# Patient Record
Sex: Female | Born: 1985 | Race: White | Hispanic: No | Marital: Married | Smoking: Current every day smoker
Health system: Southern US, Community
[De-identification: ages and names within clinical notes are randomized; demographics above are authoritative.]

## PROBLEM LIST (undated history)

## (undated) DIAGNOSIS — K829 Disease of gallbladder, unspecified: Secondary | ICD-10-CM

## (undated) DIAGNOSIS — Z3A19 19 weeks gestation of pregnancy: Secondary | ICD-10-CM

## (undated) DIAGNOSIS — F329 Major depressive disorder, single episode, unspecified: Secondary | ICD-10-CM

## (undated) DIAGNOSIS — E559 Vitamin D deficiency, unspecified: Secondary | ICD-10-CM

## (undated) DIAGNOSIS — O139 Gestational [pregnancy-induced] hypertension without significant proteinuria, unspecified trimester: Secondary | ICD-10-CM

## (undated) DIAGNOSIS — N12 Tubulo-interstitial nephritis, not specified as acute or chronic: Secondary | ICD-10-CM

## (undated) DIAGNOSIS — I10 Essential (primary) hypertension: Secondary | ICD-10-CM

## (undated) DIAGNOSIS — K219 Gastro-esophageal reflux disease without esophagitis: Secondary | ICD-10-CM

## (undated) DIAGNOSIS — M549 Dorsalgia, unspecified: Secondary | ICD-10-CM

## (undated) DIAGNOSIS — G4733 Obstructive sleep apnea (adult) (pediatric): Secondary | ICD-10-CM

## (undated) DIAGNOSIS — K915 Postcholecystectomy syndrome: Secondary | ICD-10-CM

## (undated) DIAGNOSIS — R002 Palpitations: Secondary | ICD-10-CM

## (undated) DIAGNOSIS — E119 Type 2 diabetes mellitus without complications: Secondary | ICD-10-CM

## (undated) DIAGNOSIS — I4729 Other ventricular tachycardia: Secondary | ICD-10-CM

## (undated) DIAGNOSIS — N390 Urinary tract infection, site not specified: Secondary | ICD-10-CM

## (undated) DIAGNOSIS — F410 Panic disorder [episodic paroxysmal anxiety] without agoraphobia: Secondary | ICD-10-CM

## (undated) DIAGNOSIS — I493 Ventricular premature depolarization: Secondary | ICD-10-CM

## (undated) DIAGNOSIS — F32A Depression, unspecified: Secondary | ICD-10-CM

## (undated) DIAGNOSIS — K2960 Other gastritis without bleeding: Secondary | ICD-10-CM

## (undated) DIAGNOSIS — E785 Hyperlipidemia, unspecified: Secondary | ICD-10-CM

## (undated) DIAGNOSIS — I517 Cardiomegaly: Secondary | ICD-10-CM

## (undated) DIAGNOSIS — F419 Anxiety disorder, unspecified: Secondary | ICD-10-CM

## (undated) HISTORY — DX: Disease of gallbladder, unspecified: K82.9

## (undated) HISTORY — DX: Hyperlipidemia, unspecified: E78.5

## (undated) HISTORY — PX: LITHOTRIPSY: SUR834

## (undated) HISTORY — DX: Ventricular premature depolarization: I49.3

## (undated) HISTORY — PX: ADENOIDECTOMY: SUR15

## (undated) HISTORY — DX: Essential (primary) hypertension: I10

## (undated) HISTORY — DX: Other gastritis without bleeding: K29.60

## (undated) HISTORY — PX: TONSILLECTOMY: SUR1361

## (undated) HISTORY — DX: Dorsalgia, unspecified: M54.9

## (undated) HISTORY — DX: Cardiomegaly: I51.7

## (undated) HISTORY — DX: Gastro-esophageal reflux disease without esophagitis: K21.9

## (undated) HISTORY — DX: Panic disorder (episodic paroxysmal anxiety): F41.0

## (undated) HISTORY — DX: Anxiety disorder, unspecified: F41.9

## (undated) HISTORY — PX: CHOLECYSTECTOMY: SHX55

## (undated) HISTORY — PX: KIDNEY STONE SURGERY: SHX686

## (undated) HISTORY — DX: Vitamin D deficiency, unspecified: E55.9

## (undated) HISTORY — DX: Other ventricular tachycardia: I47.29

## (undated) HISTORY — DX: Palpitations: R00.2

## (undated) HISTORY — DX: Postcholecystectomy syndrome: K91.5

## (undated) HISTORY — DX: 19 weeks gestation of pregnancy: Z3A.19

## (undated) HISTORY — DX: Obstructive sleep apnea (adult) (pediatric): G47.33

---

## 2013-10-12 HISTORY — PX: DILATION AND CURETTAGE OF UTERUS: SHX78

## 2013-11-11 ENCOUNTER — Encounter (HOSPITAL_COMMUNITY): Payer: Self-pay | Admitting: Emergency Medicine

## 2013-11-11 ENCOUNTER — Emergency Department (HOSPITAL_COMMUNITY)
Admission: EM | Admit: 2013-11-11 | Discharge: 2013-11-12 | Disposition: A | Discharge status: Home / Self Care | Payer: Medicaid Other | Attending: Emergency Medicine | Admitting: Emergency Medicine

## 2013-11-11 DIAGNOSIS — Z792 Long term (current) use of antibiotics: Secondary | ICD-10-CM | POA: Insufficient documentation

## 2013-11-11 DIAGNOSIS — Z3490 Encounter for supervision of normal pregnancy, unspecified, unspecified trimester: Secondary | ICD-10-CM

## 2013-11-11 DIAGNOSIS — R109 Unspecified abdominal pain: Secondary | ICD-10-CM | POA: Insufficient documentation

## 2013-11-11 DIAGNOSIS — O2390 Unspecified genitourinary tract infection in pregnancy, unspecified trimester: Secondary | ICD-10-CM | POA: Insufficient documentation

## 2013-11-11 DIAGNOSIS — N83209 Unspecified ovarian cyst, unspecified side: Secondary | ICD-10-CM | POA: Insufficient documentation

## 2013-11-11 DIAGNOSIS — N83202 Unspecified ovarian cyst, left side: Secondary | ICD-10-CM

## 2013-11-11 DIAGNOSIS — B9689 Other specified bacterial agents as the cause of diseases classified elsewhere: Secondary | ICD-10-CM | POA: Insufficient documentation

## 2013-11-11 DIAGNOSIS — Z3202 Encounter for pregnancy test, result negative: Secondary | ICD-10-CM | POA: Insufficient documentation

## 2013-11-11 DIAGNOSIS — O9989 Other specified diseases and conditions complicating pregnancy, childbirth and the puerperium: Secondary | ICD-10-CM | POA: Insufficient documentation

## 2013-11-11 DIAGNOSIS — Z79899 Other long term (current) drug therapy: Secondary | ICD-10-CM | POA: Insufficient documentation

## 2013-11-11 DIAGNOSIS — A499 Bacterial infection, unspecified: Secondary | ICD-10-CM | POA: Insufficient documentation

## 2013-11-11 DIAGNOSIS — N760 Acute vaginitis: Secondary | ICD-10-CM | POA: Insufficient documentation

## 2013-11-11 HISTORY — DX: Urinary tract infection, site not specified: N39.0

## 2013-11-11 LAB — POCT PREGNANCY, URINE: Preg Test, Ur: POSITIVE — AB

## 2013-11-11 LAB — CBC WITH DIFFERENTIAL/PLATELET
Basophils Absolute: 0 10*3/uL (ref 0.0–0.1)
Basophils Relative: 0 % (ref 0–1)
Eosinophils Absolute: 0.1 10*3/uL (ref 0.0–0.7)
Eosinophils Relative: 1 % (ref 0–5)
HCT: 34.5 % — ABNORMAL LOW (ref 36.0–46.0)
Hemoglobin: 11.9 g/dL — ABNORMAL LOW (ref 12.0–15.0)
LYMPHS ABS: 2.2 10*3/uL (ref 0.7–4.0)
LYMPHS PCT: 35 % (ref 12–46)
MCH: 31.3 pg (ref 26.0–34.0)
MCHC: 34.5 g/dL (ref 30.0–36.0)
MCV: 90.8 fL (ref 78.0–100.0)
Monocytes Absolute: 0.8 10*3/uL (ref 0.1–1.0)
Monocytes Relative: 12 % (ref 3–12)
NEUTROS PCT: 51 % (ref 43–77)
Neutro Abs: 3.1 10*3/uL (ref 1.7–7.7)
PLATELETS: 200 10*3/uL (ref 150–400)
RBC: 3.8 MIL/uL — AB (ref 3.87–5.11)
RDW: 12.7 % (ref 11.5–15.5)
WBC: 6.1 10*3/uL (ref 4.0–10.5)

## 2013-11-11 LAB — URINALYSIS, ROUTINE W REFLEX MICROSCOPIC
BILIRUBIN URINE: NEGATIVE
Glucose, UA: NEGATIVE mg/dL
HGB URINE DIPSTICK: NEGATIVE
Ketones, ur: NEGATIVE mg/dL
Leukocytes, UA: NEGATIVE
Nitrite: NEGATIVE
Protein, ur: NEGATIVE mg/dL
Specific Gravity, Urine: 1.025 (ref 1.005–1.030)
UROBILINOGEN UA: 0.2 mg/dL (ref 0.0–1.0)
pH: 6 (ref 5.0–8.0)

## 2013-11-11 LAB — COMPREHENSIVE METABOLIC PANEL
ALK PHOS: 39 U/L (ref 39–117)
ALT: 17 U/L (ref 0–35)
AST: 15 U/L (ref 0–37)
Albumin: 3.4 g/dL — ABNORMAL LOW (ref 3.5–5.2)
BUN: 9 mg/dL (ref 6–23)
CO2: 25 meq/L (ref 19–32)
Calcium: 9.6 mg/dL (ref 8.4–10.5)
Chloride: 100 mEq/L (ref 96–112)
Creatinine, Ser: 0.48 mg/dL — ABNORMAL LOW (ref 0.50–1.10)
GFR calc non Af Amer: >90 mL/min (ref >90)
GLUCOSE: 83 mg/dL (ref 70–99)
POTASSIUM: 4 meq/L (ref 3.7–5.3)
SODIUM: 140 meq/L (ref 137–147)
TOTAL PROTEIN: 7 g/dL (ref 6.0–8.3)
Total Bilirubin: <0.2 mg/dL — ABNORMAL LOW (ref 0.3–1.2)

## 2013-11-11 LAB — LIPASE, BLOOD: Lipase: 24 U/L (ref 11–59)

## 2013-11-11 MED ORDER — ACETAMINOPHEN 325 MG PO TABS
650.0000 mg | ORAL_TABLET | Freq: Once | ORAL | Status: AC
  Administered 2013-11-11: 650 mg via ORAL
  Filled 2013-11-11: qty 2

## 2013-11-11 NOTE — ED Notes (Addendum)
Pt. reports LLQ pain / left lower back pain onset Tuesday this week , seen at [facility] diagnosed with UTI currently taking Keflex antibiotic , denies dysuria / no nausea or vomitting . Pt. Stated she is [redacted] weeks pregnant ( G2P0).

## 2013-11-11 NOTE — ED Provider Notes (Signed)
TIME SEEN: 10:16 PM  CHIEF COMPLAINT: Left adnexal pain  HPI: Patient is a 28 y.o. G2 P0 A1 who is approximately 11 weeks and 3 days pregnant by her estimated due date from her last transvaginal ultrasound done at [facility] in [place] (which identified an IUP with normal FHT per patient report) who presents the emergency department with several days of left adnexal pain that has been worse today. She denies that she's had any fevers, chills, nausea, vomiting, diarrhea, dysuria or hematuria, vaginal bleeding. She has had a small amount of discharge. She reports her last menstrual period was in August 2014. She was pregnant and had a spontaneous abortion in November 2014 and underwent D&C. She never had a normal period before she became pregnant again. She denies a prior history of STI's. No other prior abdominal surgery. She denies any radiation of her pain. No aggravating or relieving factors.    ROS: See HPI Constitutional: no fever  Eyes: no drainage  ENT: no runny nose   Cardiovascular:  no chest pain  Resp: no SOB  GI: no vomiting GU: no dysuria Integumentary: no rash  Allergy: no hives  Musculoskeletal: no leg swelling  Neurological: no slurred speech ROS otherwise negative  PAST MEDICAL HISTORY/PAST SURGICAL HISTORY:  Past Medical History  Diagnosis Date  . UTI (lower urinary tract infection)     MEDICATIONS:  Prior to Admission medications   Medication Sig Start Date End Date Taking? Authorizing Provider  acetaminophen (TYLENOL) 325 MG tablet Take 325-650 mg by mouth every 6 (six) hours as needed (pain).   Yes Historical Provider, MD  cephALEXin (KEFLEX) 500 MG capsule Take 500 mg by mouth 4 (four) times daily.   Yes Historical Provider, MD  Prenatal Multivit-Min-Fe-FA (PRENATAL VITAMINS PO) Take 1 tablet by mouth daily.   Yes Historical Provider, MD  progesterone (PROMETRIUM) 100 MG capsule Take 100 mg by mouth daily.   Yes Historical Provider, MD  ranitidine  (ZANTAC) 150 MG tablet Take 150 mg by mouth daily.   Yes Historical Provider, MD    ALLERGIES:  No Known Allergies  SOCIAL HISTORY:  History  Substance Use Topics  . Smoking status: Never Smoker   . Smokeless tobacco: Not on file  . Alcohol Use: No    FAMILY HISTORY: No family history on file.  EXAM: BP 148/84  Pulse 73  Temp(Src) 98.3 F (36.8 C) (Oral)  Resp 16  Ht 5\' 9"  (1.753 m)  Wt 270 lb (122.471 kg)  BMI 39.85 kg/m2  SpO2 100% CONSTITUTIONAL: Alert and oriented and responds appropriately to questions. Well-appearing; well-nourished HEAD: Normocephalic EYES: Conjunctivae clear, PERRL ENT: normal nose; no rhinorrhea; moist mucous membranes; pharynx without lesions noted NECK: Supple, no meningismus, no LAD  CARD: RRR; S1 and S2 appreciated; no murmurs, no clicks, no rubs, no gallops RESP: Normal chest excursion without splinting or tachypnea; breath sounds clear and equal bilaterally; no wheezes, no rhonchi, no rales,  ABD/GI: Normal bowel sounds; non-distended; soft, tender to palpation left lower quadrant without rebound or guarding, no peritoneal signs GU:  Normal external genitalia, there is a moderate amount of thick, white vaginal discharge without odor, no cervical motion tenderness, no right adnexal tenderness or fullness, patient has significant left adnexal tenderness without fullness, no vaginal bleeding BACK:  The back appears normal and is non-tender to palpation, there is no CVA tenderness EXT: Normal ROM in all joints; non-tender to palpation; no edema; normal capillary refill; no cyanosis    SKIN: Normal  color for age and race; warm NEURO: Moves all extremities equally PSYCH: The patient's mood and manner are appropriate. Grooming and personal hygiene are appropriate.  MEDICAL DECISION MAKING: Patient's labs are reassuring. Urine shows no sign of infection. Her wet prep showed clue cells. Will treat for bacterial vaginosis. Given patient's significant  left adnexal tenderness on exam, will obtain transvaginal ultrasound. Low suspicion for TOA given patient is [redacted] weeks pregnant. Likely ovarian cyst. Unlikely ectopic pregnancy given patient has had prior transvaginal ultrasound showing an IUP per her report.  ED PROGRESS: Patient has an intrauterine pregnancy seen on ultrasound that is measuring approximately 12 weeks and 5 days. She has a 3 cm left ovarian cyst. There is no torsion. She is normal flow in this ovary. We'll discharge home with return precautions, OB/GYN followup. Patient and husband verbalize understanding and are comfortable with plan.     Emily MawKristen N Alejah Aristizabal, DO 11/12/13 [phone]

## 2013-11-12 ENCOUNTER — Emergency Department (HOSPITAL_COMMUNITY): Payer: Medicaid Other

## 2013-11-12 LAB — WET PREP, GENITAL
Trich, Wet Prep: NONE SEEN
YEAST WET PREP: NONE SEEN

## 2013-11-12 MED ORDER — METRONIDAZOLE 500 MG PO TABS: 500.0000 mg | ORAL_TABLET | Freq: Two times a day (BID) | ORAL | Status: DC

## 2013-11-12 MED ORDER — CALCIUM CARBONATE ANTACID 500 MG PO CHEW
2.0000 | CHEWABLE_TABLET | Freq: Once | ORAL | Status: AC
  Administered 2013-11-12: 400 mg via ORAL
  Filled 2013-11-12: qty 2

## 2013-11-12 NOTE — Discharge Instructions (Signed)
Bacterial Vaginosis Bacterial vaginosis is a vaginal infection that occurs when the normal balance of bacteria in the vagina is disrupted. It results from an overgrowth of certain bacteria. This is the most common vaginal infection in women of childbearing age. Treatment is important to prevent complications, especially in pregnant women, as it can cause a premature delivery. CAUSES  Bacterial vaginosis is caused by an increase in harmful bacteria that are normally present in smaller amounts in the vagina. Several different kinds of bacteria can cause bacterial vaginosis. However, the reason that the condition develops is not fully understood. RISK FACTORS Certain activities or behaviors can put you at an increased risk of developing bacterial vaginosis, including:  Having a new sex partner or multiple sex partners.  Douching.  Using an intrauterine device (IUD) for contraception. Women do not get bacterial vaginosis from toilet seats, bedding, swimming pools, or contact with objects around them. SIGNS AND SYMPTOMS  Some women with bacterial vaginosis have no signs or symptoms. Common symptoms include:  Grey vaginal discharge.  A fishlike odor with discharge, especially after sexual intercourse.  Itching or burning of the vagina and vulva.  Burning or pain with urination. DIAGNOSIS  Your health care provider will take a medical history and examine the vagina for signs of bacterial vaginosis. A sample of vaginal fluid may be taken. Your health care provider will look at this sample under a microscope to check for bacteria and abnormal cells. A vaginal pH test may also be done.  TREATMENT  Bacterial vaginosis may be treated with antibiotic medicines. These may be given in the form of a pill or a vaginal cream. A second round of antibiotics may be prescribed if the condition comes back after treatment.  HOME CARE INSTRUCTIONS   Only take over-the-counter or prescription medicines as  directed by your health care provider.  If antibiotic medicine was prescribed, take it as directed. Make sure you finish it even if you start to feel better.  Do not have sex until treatment is completed.  Tell all sexual partners that you have a vaginal infection. They should see their health care provider and be treated if they have problems, such as a mild rash or itching.  Practice safe sex by using condoms and only having one sex partner. SEEK MEDICAL CARE IF:   Your symptoms are not improving after 3 days of treatment.  You have increased discharge or pain.  You have a fever. MAKE SURE YOU:   Understand these instructions.  Will watch your condition.  Will get help right away if you are not doing well or get worse. FOR MORE INFORMATION  Centers for Disease Control and Prevention, Division of STD Prevention: [employer]raud[employer] American Sexual Health Association (ASHA): www.ashastd.org  Document Released: 09/28/2005 Document Revised: 07/19/2013 Document Reviewed: 05/10/2013 [facility] Patient Information 2014 [place].  Pregnancy - First Trimester During sexual intercourse, millions of sperm go into the vagina. Only 1 sperm will penetrate and fertilize the female egg while it is in the Fallopian tube. One week later, the fertilized egg implants into the wall of the uterus. An embryo begins to develop into a baby. At 6 to 8 weeks, the eyes and face are formed and the heartbeat can be seen on ultrasound. At the end of 12 weeks (first trimester), all the baby's organs are formed. Now that you are pregnant, you will want to do everything you can to have a healthy baby. Two of the most important things are to get good  prenatal care and follow your caregiver's instructions. Prenatal care is all the medical care you receive before the baby's birth. It is given to prevent, find, and treat problems during the pregnancy and childbirth. PRENATAL EXAMS  During prenatal visits, your weight,  blood pressure, and urine are checked. This is done to make sure you are healthy and progressing normally during the pregnancy.  A pregnant woman should gain 25 to 35 pounds during the pregnancy. However, if you are overweight or underweight, your caregiver will advise you regarding your weight.  Your caregiver will ask and answer questions for you.  Blood work, cervical cultures, other necessary tests, and a Pap test are done during your prenatal exams. These tests are done to check on your health and the probable health of your baby. Tests are strongly recommended and done for HIV with your permission. This is the virus that causes AIDS. These tests are done because medicines can be given to help prevent your baby from being born with this infection should you have been infected without knowing it. Blood work is also used to find out your blood type, previous infections, and follow your blood levels (hemoglobin).  Low hemoglobin (anemia) is common during pregnancy. Iron and vitamins are given to help prevent this. Later in the pregnancy, blood tests for diabetes will be done along with any other tests if any problems develop.  You may need other tests to make sure you and the baby are doing well. CHANGES DURING THE FIRST TRIMESTER  Your body goes through many changes during pregnancy. They vary from person to person. Talk to your caregiver about changes you notice and are concerned about. Changes can include:  Your menstrual period stops.  The egg and sperm carry the genes that determine what you look like. Genes from you and your partner are forming a baby. The female genes determine whether the baby is a boy or a girl.  Your body increases in girth and you may feel bloated.  Feeling sick to your stomach (nauseous) and throwing up (vomiting). If the vomiting is uncontrollable, call your caregiver.  Your breasts will begin to enlarge and become tender.  Your nipples may stick out more and  become darker.  The need to urinate more. Painful urination may mean you have a bladder infection.  Tiring easily.  Loss of appetite.  Cravings for certain kinds of food.  At first, you may gain or lose a couple of pounds.  You may have changes in your emotions from day to day (excited to be pregnant or concerned something may go wrong with the pregnancy and baby).  You may have more vivid and strange dreams. HOME CARE INSTRUCTIONS   It is very important to avoid all smoking, alcohol and non-prescribed drugs during your pregnancy. These affect the formation and growth of the baby. Avoid chemicals while pregnant to ensure the delivery of a healthy infant.  Start your prenatal visits by the 12th week of pregnancy. They are usually scheduled monthly at first, then more often in the last 2 months before delivery. Keep your caregiver's appointments. Follow your caregiver's instructions regarding medicine use, blood and lab tests, exercise, and diet.  During pregnancy, you are providing food for you and your baby. Eat regular, well-balanced meals. Choose foods such as meat, fish, milk and other low fat dairy products, vegetables, fruits, and whole-grain breads and cereals. Your caregiver will tell you of the ideal weight gain.  You can help morning sickness by keeping  soda crackers at the bedside. Eat a couple before arising in the morning. You may want to use the crackers without salt on them.  Eating 4 to 5 small meals rather than 3 large meals a day also may help the nausea and vomiting.  Drinking liquids between meals instead of during meals also seems to help nausea and vomiting.  A physical sexual relationship may be continued throughout pregnancy if there are no other problems. Problems may be early (premature) leaking of amniotic fluid from the membranes, vaginal bleeding, or belly (abdominal) pain.  Exercise regularly if there are no restrictions. Check with your caregiver or  physical therapist if you are unsure of the safety of some of your exercises. Greater weight gain will occur in the last 2 trimesters of pregnancy. Exercising will help:  Control your weight.  Keep you in shape.  Prepare you for labor and delivery.  Help you lose your pregnancy weight after you deliver your baby.  Wear a good support or jogging bra for breast tenderness during pregnancy. This may help if worn during sleep too.  Ask when prenatal classes are available. Begin classes when they are offered.  Do not use hot tubs, steam rooms, or saunas.  Wear your seat belt when driving. This protects you and your baby if you are in an accident.  Avoid raw meat, uncooked cheese, cat litter boxes, and soil used by cats throughout the pregnancy. These carry germs that can cause birth defects in the baby.  The first trimester is a good time to visit your dentist for your dental health. Getting your teeth cleaned is okay. Use a softer toothbrush and brush gently during pregnancy.  Ask for help if you have financial, counseling, or nutritional needs during pregnancy. Your caregiver will be able to offer counseling for these needs as well as refer you for other special needs.  Do not take any medicines or herbs unless told by your caregiver.  Inform your caregiver if there is any mental or physical domestic violence.  Make a list of emergency phone numbers of family, friends, hospital, and police and fire departments.  Write down your questions. Take them to your prenatal visit.  Do not douche.  Do not cross your legs.  If you have to stand for long periods of time, rotate you feet or take small steps in a circle.  You may have more vaginal secretions that may require a sanitary pad. Do not use tampons or scented sanitary pads. MEDICINES AND DRUG USE IN PREGNANCY  Take prenatal vitamins as directed. The vitamin should contain 1 milligram of folic acid. Keep all vitamins out of reach of  children. Only a couple vitamins or tablets containing iron may be fatal to a baby or young child when ingested.  Avoid use of all medicines, including herbs, over-the-counter medicines, not prescribed or suggested by your caregiver. Only take over-the-counter or prescription medicines for pain, discomfort, or fever as directed by your caregiver. Do not use aspirin, ibuprofen, or naproxen unless directed by your caregiver.  Let your caregiver also know about herbs you may be using.  Alcohol is related to a number of birth defects. This includes fetal alcohol syndrome. All alcohol, in any form, should be avoided completely. Smoking will cause low birth rate and premature babies.  Street or illegal drugs are very harmful to the baby. They are absolutely forbidden. A baby born to an addicted mother will be addicted at birth. The baby will go through the  same withdrawal an adult does.  Let your caregiver know about any medicines that you have to take and for what reason you take them. SEEK MEDICAL CARE IF:  You have any concerns or worries during your pregnancy. It is better to call with your questions if you feel they cannot wait, rather than worry about them. SEEK IMMEDIATE MEDICAL CARE IF:   An unexplained oral temperature above 102 F (38.9 C) develops, or as your caregiver suggests.  You have leaking of fluid from the vagina (birth canal). If leaking membranes are suspected, take your temperature and inform your caregiver of this when you call.  There is vaginal spotting or bleeding. Notify your caregiver of the amount and how many pads are used.  You develop a bad smelling vaginal discharge with a change in the color.  You continue to feel sick to your stomach (nauseated) and have no relief from remedies suggested. You vomit blood or coffee ground-like materials.  You lose more than 2 pounds of weight in 1 week.  You gain more than 2 pounds of weight in 1 week and you notice swelling of  your face, hands, feet, or legs.  You gain 5 pounds or more in 1 week (even if you do not have swelling of your hands, face, legs, or feet).  You get exposed to [place] measles and have never had them.  You are exposed to fifth disease or chickenpox.  You develop belly (abdominal) pain. Round ligament discomfort is a common non-cancerous (benign) cause of abdominal pain in pregnancy. Your caregiver still must evaluate this.  You develop headache, fever, diarrhea, pain with urination, or shortness of breath.  You fall or are in a car accident or have any kind of trauma.  There is mental or physical violence in your home. Document Released: 09/22/2001 Document Revised: 06/22/2012 Document Reviewed: 03/26/2009 [facility] Patient Information 2014 [place].  Ovarian Cyst An ovarian cyst is a fluid-filled sac that forms on an ovary. The ovaries are small organs that produce eggs in women. Various types of cysts can form on the ovaries. Most are not cancerous. Many do not cause problems, and they often go away on their own. Some may cause symptoms and require treatment. Common types of ovarian cysts include:  Functional cysts These cysts may occur every month during the menstrual cycle. This is normal. The cysts usually go away with the next menstrual cycle if the woman does not get pregnant. Usually, there are no symptoms with a functional cyst.  Endometrioma cysts These cysts form from the tissue that lines the uterus. They are also called "chocolate cysts" because they become filled with blood that turns brown. This type of cyst can cause pain in the lower abdomen during intercourse and with your menstrual period.  Cystadenoma cysts This type develops from the cells on the outside of the ovary. These cysts can get very big and cause lower abdomen pain and pain with intercourse. This type of cyst can twist on itself, cut off its blood supply, and cause severe pain. It can also easily rupture  and cause a lot of pain.  Dermoid cysts This type of cyst is sometimes found in both ovaries. These cysts may contain different kinds of body tissue, such as skin, teeth, hair, or cartilage. They usually do not cause symptoms unless they get very big.  Theca lutein cysts These cysts occur when too much of a certain hormone (human chorionic gonadotropin) is produced and overstimulates the ovaries to  produce an egg. This is most common after procedures used to assist with the conception of a baby (in vitro fertilization). CAUSES   Fertility drugs can cause a condition in which multiple large cysts are formed on the ovaries. This is called ovarian hyperstimulation syndrome.  A condition called polycystic ovary syndrome can cause hormonal imbalances that can lead to nonfunctional ovarian cysts. SIGNS AND SYMPTOMS  Many ovarian cysts do not cause symptoms. If symptoms are present, they may include:  Pelvic pain or pressure.  Pain in the lower abdomen.  Pain during sexual intercourse.  Increasing girth (swelling) of the abdomen.  Abnormal menstrual periods.  Increasing pain with menstrual periods.  Stopping having menstrual periods without being pregnant. DIAGNOSIS  These cysts are commonly found during a routine or annual pelvic exam. Tests may be ordered to find out more about the cyst. These tests may include:  Ultrasound.  X-ray of the pelvis.  CT scan.  MRI.  Blood tests. TREATMENT  Many ovarian cysts go away on their own without treatment. Your health care provider may want to check your cyst regularly for 2 3 months to see if it changes. For women in menopause, it is particularly important to monitor a cyst closely because of the higher rate of ovarian cancer in menopausal women. When treatment is needed, it may include any of the following:  A procedure to drain the cyst (aspiration). This may be done using a long needle and ultrasound. It can also be done through a  laparoscopic procedure. This involves using a thin, lighted tube with a tiny camera on the end (laparoscope) inserted through a small incision.  Surgery to remove the whole cyst. This may be done using laparoscopic surgery or an open surgery involving a larger incision in the lower abdomen.  Hormone treatment or birth control pills. These methods are sometimes used to help dissolve a cyst. HOME CARE INSTRUCTIONS   Only take over-the-counter or prescription medicines as directed by your health care provider.  Follow up with your health care provider as directed.  Get regular pelvic exams and Pap tests. SEEK MEDICAL CARE IF:   Your periods are late, irregular, or painful, or they stop.  Your pelvic pain or abdominal pain does not go away.  Your abdomen becomes larger or swollen.  You have pressure on your bladder or trouble emptying your bladder completely.  You have pain during sexual intercourse.  You have feelings of fullness, pressure, or discomfort in your stomach.  You lose weight for no apparent reason.  You feel generally ill.  You become constipated.  You lose your appetite.  You develop acne.  You have an increase in body and facial hair.  You are gaining weight, without changing your exercise and eating habits.  You think you are pregnant. SEEK IMMEDIATE MEDICAL CARE IF:   You have increasing abdominal pain.  You feel sick to your stomach (nauseous), and you throw up (vomit).  You develop a fever that comes on suddenly.  You have abdominal pain during a bowel movement.  Your menstrual periods become heavier than usual. Document Released: 09/28/2005 Document Revised: 07/19/2013 Document Reviewed: 06/05/2013 [facility] Patient Information 2014 [place], [place].

## 2013-11-13 LAB — GC/CHLAMYDIA PROBE AMP
CT PROBE, AMP APTIMA: NEGATIVE
GC Probe RNA: NEGATIVE

## 2014-04-06 DIAGNOSIS — O1493 Unspecified pre-eclampsia, third trimester: Secondary | ICD-10-CM | POA: Insufficient documentation

## 2014-04-06 HISTORY — DX: Unspecified pre-eclampsia, third trimester: O14.93

## 2014-08-13 ENCOUNTER — Encounter (HOSPITAL_COMMUNITY): Payer: Self-pay | Admitting: Emergency Medicine

## 2015-04-12 HISTORY — PX: CHOLECYSTECTOMY: SHX55

## 2015-04-24 ENCOUNTER — Emergency Department (HOSPITAL_COMMUNITY)
Admission: EM | Admit: 2015-04-24 | Discharge: 2015-04-25 | Disposition: A | Discharge status: Home / Self Care | Payer: BLUE CROSS/BLUE SHIELD | Attending: Emergency Medicine | Admitting: Emergency Medicine

## 2015-04-24 ENCOUNTER — Emergency Department (HOSPITAL_COMMUNITY): Payer: BLUE CROSS/BLUE SHIELD

## 2015-04-24 DIAGNOSIS — R1010 Upper abdominal pain, unspecified: Secondary | ICD-10-CM

## 2015-04-24 DIAGNOSIS — R1011 Right upper quadrant pain: Secondary | ICD-10-CM | POA: Diagnosis present

## 2015-04-24 DIAGNOSIS — Z79899 Other long term (current) drug therapy: Secondary | ICD-10-CM | POA: Diagnosis not present

## 2015-04-24 DIAGNOSIS — K8020 Calculus of gallbladder without cholecystitis without obstruction: Secondary | ICD-10-CM

## 2015-04-24 DIAGNOSIS — Z87440 Personal history of urinary (tract) infections: Secondary | ICD-10-CM | POA: Insufficient documentation

## 2015-04-24 LAB — CBC
HEMATOCRIT: 40 % (ref 36.0–46.0)
Hemoglobin: 13.2 g/dL (ref 12.0–15.0)
MCH: 28.8 pg (ref 26.0–34.0)
MCHC: 33 g/dL (ref 30.0–36.0)
MCV: 87.3 fL (ref 78.0–100.0)
PLATELETS: 249 10*3/uL (ref 150–400)
RBC: 4.58 MIL/uL (ref 3.87–5.11)
RDW: 13.3 % (ref 11.5–15.5)
WBC: 9.5 10*3/uL (ref 4.0–10.5)

## 2015-04-24 LAB — URINALYSIS, ROUTINE W REFLEX MICROSCOPIC
Bilirubin Urine: NEGATIVE
Glucose, UA: NEGATIVE mg/dL
HGB URINE DIPSTICK: NEGATIVE
Ketones, ur: NEGATIVE mg/dL
Leukocytes, UA: NEGATIVE
Nitrite: NEGATIVE
PH: 5 (ref 5.0–8.0)
Protein, ur: NEGATIVE mg/dL
SPECIFIC GRAVITY, URINE: 1.03 (ref 1.005–1.030)
Urobilinogen, UA: 0.2 mg/dL (ref 0.0–1.0)

## 2015-04-24 LAB — I-STAT BETA HCG BLOOD, ED (MC, WL, AP ONLY): I-stat hCG, quantitative: <5 m[IU]/mL (ref <5)

## 2015-04-24 LAB — COMPREHENSIVE METABOLIC PANEL
ALT: 30 U/L (ref 14–54)
AST: 23 U/L (ref 15–41)
Albumin: 4.1 g/dL (ref 3.5–5.0)
Alkaline Phosphatase: 64 U/L (ref 38–126)
Anion gap: 10 (ref 5–15)
BUN: 12 mg/dL (ref 6–20)
CHLORIDE: 105 mmol/L (ref 101–111)
CO2: 24 mmol/L (ref 22–32)
CREATININE: 0.64 mg/dL (ref 0.44–1.00)
Calcium: 9.3 mg/dL (ref 8.9–10.3)
GFR calc non Af Amer: >60 mL/min (ref >60)
GLUCOSE: 84 mg/dL (ref 65–99)
POTASSIUM: 3.5 mmol/L (ref 3.5–5.1)
Sodium: 139 mmol/L (ref 135–145)
Total Bilirubin: 0.2 mg/dL — ABNORMAL LOW (ref 0.3–1.2)
Total Protein: 7.2 g/dL (ref 6.5–8.1)

## 2015-04-24 LAB — LIPASE, BLOOD: Lipase: 24 U/L (ref 22–51)

## 2015-04-24 NOTE — ED Notes (Signed)
Patient transported to Ultrasound 

## 2015-04-24 NOTE — ED Notes (Signed)
Patient here with complaint of abdominal pain. States when pregnant with her child she was told that she has gallstones via US about 1.5years ago. Reports since Friday she has had diffuse abdominal pain, and has noticed some alterations in her bowel habits.

## 2015-04-25 MED ORDER — OXYCODONE-ACETAMINOPHEN 5-325 MG PO TABS: 1.0000 | ORAL_TABLET | Freq: Four times a day (QID) | ORAL | Status: DC | PRN

## 2015-04-25 NOTE — ED Notes (Signed)
Pt verbalizes understanding of d/c instructions and denies any further needs at this time. 

## 2015-04-25 NOTE — ED Provider Notes (Signed)
CSN: [id number]     Arrival date & time 04/24/15  1919 History   First MD Initiated Contact with Patient 04/24/15 2221     Chief Complaint  Patient presents with  . Abdominal Pain     (Consider location/radiation/quality/duration/timing/severity/associated sxs/prior Treatment) HPI Patient presents to the emergency department with right upper abdominal pain that started getting worse since Friday.  She states that she has had some issues with her gallbladder since her pregnancy.  She states that palpation makes the pain worse.  Patient denies vomiting, diarrhea, weakness, dizziness, headache, blurred vision, chest pain, shortness of breath, dysuria, incontinence, diarrhea, back pain, or syncope.  The patient states that she has had some loose stools but not watery diarrhea.  Patient states that palpation makes the pain worse and eating fast food Past Medical History  Diagnosis Date  . UTI (lower urinary tract infection)    No past surgical history on file. No family history on file. History  Substance Use Topics  . Smoking status: Never Smoker   . Smokeless tobacco: Not on file  . Alcohol Use: No   OB History    Gravida Para Term Preterm AB TAB SAB Ectopic Multiple Living   1              Review of Systems  All other systems negative except as documented in the HPI. All pertinent positives and negatives as reviewed in the HPI.  Allergies  Review of patient's allergies indicates no known allergies.  Home Medications   Prior to Admission medications   Medication Sig Start Date End Date Taking? Authorizing Provider  acetaminophen (TYLENOL) 325 MG tablet Take 325-650 mg by mouth every 6 (six) hours as needed (pain).   Yes Historical Provider, MD  ranitidine (ZANTAC) 150 MG tablet Take 150 mg by mouth daily.   Yes Historical Provider, MD   BP 143/76 mmHg  Pulse 73  Temp(Src) 98.4 F (36.9 C) (Oral)  Resp 16  Ht  (1.753 m)  Wt 310 lb 8 oz (140.842 kg)  BMI 45.83 kg/m2   SpO2 100% Physical Exam  Constitutional: She is oriented to person, place, and time. She appears well-developed and well-nourished. No distress.  HENT:  Head: Normocephalic and atraumatic.  Mouth/Throat: Oropharynx is clear and moist.  Eyes: Pupils are equal, round, and reactive to light.  Neck: Normal range of motion. Neck supple.  Cardiovascular: Normal rate, regular rhythm and normal heart sounds.  Exam reveals no gallop and no friction rub.   No murmur heard. Pulmonary/Chest: Effort normal and breath sounds normal. No respiratory distress.  Abdominal: Soft. Bowel sounds are normal. She exhibits no distension. There is tenderness. There is no rebound and no guarding.  Neurological: She is alert and oriented to person, place, and time. She exhibits normal muscle tone. Coordination normal.  Skin: Skin is warm and dry. No rash noted. No erythema.  Psychiatric: She has a normal mood and affect. Her behavior is normal.  Nursing note and vitals reviewed.   ED Course  Procedures (including critical care time) Labs Review Labs Reviewed  COMPREHENSIVE METABOLIC PANEL - Abnormal; Notable for the following:    Total Bilirubin 0.2 (*)    All other components within normal limits  LIPASE, BLOOD  CBC  URINALYSIS, ROUTINE W REFLEX MICROSCOPIC (NOT AT [facility])  I-STAT BETA HCG BLOOD, ED ([facility], [facility], [facility] ONLY)    Imaging Review US Abdomen Complete  04/25/2015   CLINICAL DATA:  29 year old female with upper abdominal pain  EXAM: ULTRASOUND ABDOMEN COMPLETE  COMPARISON:  Ultrasound dated 02/07/2014  FINDINGS: Evaluation is limited due to patient's body habitus.  Gallbladder: The gallbladder is filled with stones with sonographic appearance of wall echo shadow. There is no pericholecystic fluid. No tenderness was elicited over the gallbladder area during scanning.  Common bile duct: Diameter: 3 mm  Liver: The liver appears echogenic and difficult to penetrate by ultrasound, likely due to patient's body  habitus and a degree of hepatic steatosis.  IVC: Not well seen.  Pancreas: Not visualized  Spleen: Size and appearance within normal limits.  Right Kidney: Length: 13 cm. Echogenicity within normal limits. No mass or hydronephrosis visualized.  Left Kidney: Length: 13 cm. Echogenicity within normal limits. No mass or hydronephrosis visualized.  Abdominal aorta: Unremarkable as visualized.  Other findings: None.  IMPRESSION: Cholelithiasis without sonographic evidence of acute cholecystitis. A hepatobiliary scintigraphy may provide better evaluation of the gallbladder if an acute cholecystitis is clinically suspected.   Electronically Signed   By: Elgie CollardArash  Radparvar M.D.   On: 04/25/2015 00:37    The patient's pain is minimal at this point, I feel like she can follow-up as an outpatient for gallbladder.  The patient agrees to the plan and all questions were answered.  I do feel that she needs close surgical follow-up and told to return here for any worsening in her condition   Charlestine NightChristopher Kaydon Husby, PA-C 04/25/15 0050  Gilda Creasehristopher J Pollina, MD 04/25/15 [phone]

## 2015-04-25 NOTE — Discharge Instructions (Signed)
Call tomorrow morning to the surgeon's office and advise them urine the emergency department and that we wanted to seen as soon as possible in their office.  Return here as needed.  Your gallbladder has many stones.  This will most likely require surgical intervention

## 2015-06-20 ENCOUNTER — Ambulatory Visit (INDEPENDENT_AMBULATORY_CARE_PROVIDER_SITE_OTHER): Payer: PRIVATE HEALTH INSURANCE | Admitting: Psychology

## 2015-06-20 DIAGNOSIS — F419 Anxiety disorder, unspecified: Secondary | ICD-10-CM | POA: Diagnosis not present

## 2015-06-20 DIAGNOSIS — O906 Postpartum mood disturbance: Secondary | ICD-10-CM

## 2015-06-21 ENCOUNTER — Ambulatory Visit: Payer: BLUE CROSS/BLUE SHIELD | Admitting: Psychology

## 2015-06-27 ENCOUNTER — Ambulatory Visit (INDEPENDENT_AMBULATORY_CARE_PROVIDER_SITE_OTHER): Payer: BLUE CROSS/BLUE SHIELD | Admitting: Psychology

## 2015-06-27 DIAGNOSIS — F419 Anxiety disorder, unspecified: Secondary | ICD-10-CM | POA: Diagnosis not present

## 2015-06-27 DIAGNOSIS — O906 Postpartum mood disturbance: Secondary | ICD-10-CM

## 2015-07-04 ENCOUNTER — Ambulatory Visit (INDEPENDENT_AMBULATORY_CARE_PROVIDER_SITE_OTHER): Payer: BLUE CROSS/BLUE SHIELD | Admitting: Psychology

## 2015-07-04 DIAGNOSIS — O906 Postpartum mood disturbance: Secondary | ICD-10-CM | POA: Diagnosis not present

## 2015-07-04 DIAGNOSIS — F419 Anxiety disorder, unspecified: Secondary | ICD-10-CM | POA: Diagnosis not present

## 2015-07-08 ENCOUNTER — Ambulatory Visit (INDEPENDENT_AMBULATORY_CARE_PROVIDER_SITE_OTHER): Payer: BLUE CROSS/BLUE SHIELD | Admitting: Psychology

## 2015-07-08 DIAGNOSIS — F419 Anxiety disorder, unspecified: Secondary | ICD-10-CM

## 2015-07-18 ENCOUNTER — Ambulatory Visit (INDEPENDENT_AMBULATORY_CARE_PROVIDER_SITE_OTHER): Payer: PRIVATE HEALTH INSURANCE | Admitting: Psychology

## 2015-07-18 DIAGNOSIS — F429 Obsessive-compulsive disorder, unspecified: Secondary | ICD-10-CM | POA: Diagnosis not present

## 2015-07-18 DIAGNOSIS — O906 Postpartum mood disturbance: Secondary | ICD-10-CM

## 2015-07-25 ENCOUNTER — Ambulatory Visit (INDEPENDENT_AMBULATORY_CARE_PROVIDER_SITE_OTHER): Payer: PRIVATE HEALTH INSURANCE | Admitting: Psychology

## 2015-07-25 DIAGNOSIS — F429 Obsessive-compulsive disorder, unspecified: Secondary | ICD-10-CM | POA: Diagnosis not present

## 2015-07-25 DIAGNOSIS — O906 Postpartum mood disturbance: Secondary | ICD-10-CM | POA: Diagnosis not present

## 2015-08-01 ENCOUNTER — Ambulatory Visit (INDEPENDENT_AMBULATORY_CARE_PROVIDER_SITE_OTHER): Payer: PRIVATE HEALTH INSURANCE | Admitting: Psychology

## 2015-08-01 DIAGNOSIS — O906 Postpartum mood disturbance: Secondary | ICD-10-CM | POA: Diagnosis not present

## 2015-08-01 DIAGNOSIS — F429 Obsessive-compulsive disorder, unspecified: Secondary | ICD-10-CM | POA: Diagnosis not present

## 2015-08-08 ENCOUNTER — Ambulatory Visit (INDEPENDENT_AMBULATORY_CARE_PROVIDER_SITE_OTHER): Payer: PRIVATE HEALTH INSURANCE | Admitting: Psychology

## 2015-08-08 DIAGNOSIS — F429 Obsessive-compulsive disorder, unspecified: Secondary | ICD-10-CM

## 2015-08-08 DIAGNOSIS — F411 Generalized anxiety disorder: Secondary | ICD-10-CM

## 2015-08-19 ENCOUNTER — Ambulatory Visit: Payer: BLUE CROSS/BLUE SHIELD | Admitting: Psychology

## 2015-08-28 ENCOUNTER — Ambulatory Visit (INDEPENDENT_AMBULATORY_CARE_PROVIDER_SITE_OTHER): Payer: PRIVATE HEALTH INSURANCE | Admitting: Psychology

## 2015-08-28 DIAGNOSIS — F429 Obsessive-compulsive disorder, unspecified: Secondary | ICD-10-CM | POA: Diagnosis not present

## 2015-08-28 DIAGNOSIS — F411 Generalized anxiety disorder: Secondary | ICD-10-CM | POA: Diagnosis not present

## 2015-09-11 ENCOUNTER — Ambulatory Visit (INDEPENDENT_AMBULATORY_CARE_PROVIDER_SITE_OTHER): Payer: PRIVATE HEALTH INSURANCE | Admitting: Psychology

## 2015-09-11 DIAGNOSIS — F429 Obsessive-compulsive disorder, unspecified: Secondary | ICD-10-CM

## 2015-09-11 DIAGNOSIS — F411 Generalized anxiety disorder: Secondary | ICD-10-CM

## 2015-09-20 ENCOUNTER — Ambulatory Visit (INDEPENDENT_AMBULATORY_CARE_PROVIDER_SITE_OTHER): Payer: PRIVATE HEALTH INSURANCE | Admitting: Psychology

## 2015-09-20 DIAGNOSIS — F411 Generalized anxiety disorder: Secondary | ICD-10-CM | POA: Diagnosis not present

## 2015-09-20 DIAGNOSIS — O906 Postpartum mood disturbance: Secondary | ICD-10-CM

## 2015-09-20 DIAGNOSIS — F429 Obsessive-compulsive disorder, unspecified: Secondary | ICD-10-CM | POA: Diagnosis not present

## 2015-09-26 ENCOUNTER — Ambulatory Visit (INDEPENDENT_AMBULATORY_CARE_PROVIDER_SITE_OTHER): Payer: PRIVATE HEALTH INSURANCE | Admitting: Psychology

## 2015-09-26 DIAGNOSIS — O906 Postpartum mood disturbance: Secondary | ICD-10-CM | POA: Diagnosis not present

## 2015-09-26 DIAGNOSIS — F429 Obsessive-compulsive disorder, unspecified: Secondary | ICD-10-CM

## 2015-09-26 DIAGNOSIS — F411 Generalized anxiety disorder: Secondary | ICD-10-CM

## 2015-10-03 ENCOUNTER — Ambulatory Visit (INDEPENDENT_AMBULATORY_CARE_PROVIDER_SITE_OTHER): Payer: PRIVATE HEALTH INSURANCE | Admitting: Psychology

## 2015-10-03 DIAGNOSIS — F411 Generalized anxiety disorder: Secondary | ICD-10-CM

## 2015-10-03 DIAGNOSIS — F429 Obsessive-compulsive disorder, unspecified: Secondary | ICD-10-CM

## 2015-10-03 DIAGNOSIS — O906 Postpartum mood disturbance: Secondary | ICD-10-CM

## 2015-10-18 ENCOUNTER — Ambulatory Visit (INDEPENDENT_AMBULATORY_CARE_PROVIDER_SITE_OTHER): Payer: PRIVATE HEALTH INSURANCE | Admitting: Psychology

## 2015-10-18 DIAGNOSIS — O906 Postpartum mood disturbance: Secondary | ICD-10-CM

## 2015-10-18 DIAGNOSIS — F429 Obsessive-compulsive disorder, unspecified: Secondary | ICD-10-CM | POA: Diagnosis not present

## 2015-10-23 ENCOUNTER — Ambulatory Visit: Payer: BLUE CROSS/BLUE SHIELD | Admitting: Psychology

## 2015-10-31 ENCOUNTER — Ambulatory Visit (INDEPENDENT_AMBULATORY_CARE_PROVIDER_SITE_OTHER): Payer: PRIVATE HEALTH INSURANCE | Admitting: Psychology

## 2015-10-31 DIAGNOSIS — F429 Obsessive-compulsive disorder, unspecified: Secondary | ICD-10-CM | POA: Diagnosis not present

## 2015-10-31 DIAGNOSIS — O906 Postpartum mood disturbance: Secondary | ICD-10-CM | POA: Diagnosis not present

## 2015-11-07 ENCOUNTER — Ambulatory Visit (INDEPENDENT_AMBULATORY_CARE_PROVIDER_SITE_OTHER): Payer: PRIVATE HEALTH INSURANCE | Admitting: Psychology

## 2015-11-07 DIAGNOSIS — F429 Obsessive-compulsive disorder, unspecified: Secondary | ICD-10-CM

## 2015-11-07 DIAGNOSIS — O906 Postpartum mood disturbance: Secondary | ICD-10-CM | POA: Diagnosis not present

## 2015-11-15 ENCOUNTER — Ambulatory Visit (INDEPENDENT_AMBULATORY_CARE_PROVIDER_SITE_OTHER): Payer: PRIVATE HEALTH INSURANCE | Admitting: Psychology

## 2015-11-15 DIAGNOSIS — O906 Postpartum mood disturbance: Secondary | ICD-10-CM

## 2015-11-15 DIAGNOSIS — F429 Obsessive-compulsive disorder, unspecified: Secondary | ICD-10-CM

## 2015-11-22 ENCOUNTER — Ambulatory Visit (INDEPENDENT_AMBULATORY_CARE_PROVIDER_SITE_OTHER): Payer: PRIVATE HEALTH INSURANCE | Admitting: Psychology

## 2015-11-22 DIAGNOSIS — F411 Generalized anxiety disorder: Secondary | ICD-10-CM

## 2015-11-22 DIAGNOSIS — O906 Postpartum mood disturbance: Secondary | ICD-10-CM | POA: Diagnosis not present

## 2015-11-22 DIAGNOSIS — F429 Obsessive-compulsive disorder, unspecified: Secondary | ICD-10-CM | POA: Diagnosis not present

## 2015-12-06 ENCOUNTER — Ambulatory Visit (INDEPENDENT_AMBULATORY_CARE_PROVIDER_SITE_OTHER): Payer: PRIVATE HEALTH INSURANCE | Admitting: Psychology

## 2015-12-06 DIAGNOSIS — O906 Postpartum mood disturbance: Secondary | ICD-10-CM

## 2015-12-06 DIAGNOSIS — F429 Obsessive-compulsive disorder, unspecified: Secondary | ICD-10-CM

## 2015-12-13 ENCOUNTER — Ambulatory Visit (INDEPENDENT_AMBULATORY_CARE_PROVIDER_SITE_OTHER): Payer: PRIVATE HEALTH INSURANCE | Admitting: Psychology

## 2015-12-13 DIAGNOSIS — F411 Generalized anxiety disorder: Secondary | ICD-10-CM | POA: Diagnosis not present

## 2015-12-13 DIAGNOSIS — F429 Obsessive-compulsive disorder, unspecified: Secondary | ICD-10-CM | POA: Diagnosis not present

## 2015-12-13 DIAGNOSIS — O906 Postpartum mood disturbance: Secondary | ICD-10-CM

## 2015-12-20 ENCOUNTER — Ambulatory Visit (INDEPENDENT_AMBULATORY_CARE_PROVIDER_SITE_OTHER): Payer: PRIVATE HEALTH INSURANCE | Admitting: Psychology

## 2015-12-20 DIAGNOSIS — O906 Postpartum mood disturbance: Secondary | ICD-10-CM | POA: Diagnosis not present

## 2015-12-20 DIAGNOSIS — F429 Obsessive-compulsive disorder, unspecified: Secondary | ICD-10-CM

## 2015-12-27 ENCOUNTER — Ambulatory Visit: Payer: PRIVATE HEALTH INSURANCE | Admitting: Psychology

## 2016-01-03 ENCOUNTER — Ambulatory Visit: Payer: PRIVATE HEALTH INSURANCE | Admitting: Psychology

## 2016-01-13 ENCOUNTER — Ambulatory Visit: Payer: PRIVATE HEALTH INSURANCE | Admitting: Psychology

## 2016-01-15 ENCOUNTER — Ambulatory Visit (INDEPENDENT_AMBULATORY_CARE_PROVIDER_SITE_OTHER): Payer: PRIVATE HEALTH INSURANCE | Admitting: Psychology

## 2016-01-15 DIAGNOSIS — F419 Anxiety disorder, unspecified: Secondary | ICD-10-CM | POA: Diagnosis not present

## 2016-01-15 DIAGNOSIS — O906 Postpartum mood disturbance: Secondary | ICD-10-CM

## 2016-01-17 DIAGNOSIS — M549 Dorsalgia, unspecified: Secondary | ICD-10-CM | POA: Diagnosis not present

## 2016-01-17 DIAGNOSIS — M2560 Stiffness of unspecified joint, not elsewhere classified: Secondary | ICD-10-CM | POA: Diagnosis not present

## 2016-01-22 DIAGNOSIS — M2560 Stiffness of unspecified joint, not elsewhere classified: Secondary | ICD-10-CM | POA: Diagnosis not present

## 2016-01-22 DIAGNOSIS — M549 Dorsalgia, unspecified: Secondary | ICD-10-CM | POA: Diagnosis not present

## 2016-02-03 ENCOUNTER — Ambulatory Visit (INDEPENDENT_AMBULATORY_CARE_PROVIDER_SITE_OTHER): Payer: PRIVATE HEALTH INSURANCE | Admitting: Psychology

## 2016-02-03 DIAGNOSIS — F411 Generalized anxiety disorder: Secondary | ICD-10-CM | POA: Diagnosis not present

## 2016-02-03 DIAGNOSIS — M2560 Stiffness of unspecified joint, not elsewhere classified: Secondary | ICD-10-CM | POA: Diagnosis not present

## 2016-02-03 DIAGNOSIS — M549 Dorsalgia, unspecified: Secondary | ICD-10-CM | POA: Diagnosis not present

## 2016-02-03 DIAGNOSIS — O906 Postpartum mood disturbance: Secondary | ICD-10-CM | POA: Diagnosis not present

## 2016-02-03 DIAGNOSIS — F429 Obsessive-compulsive disorder, unspecified: Secondary | ICD-10-CM

## 2016-02-05 DIAGNOSIS — M549 Dorsalgia, unspecified: Secondary | ICD-10-CM | POA: Diagnosis not present

## 2016-02-05 DIAGNOSIS — M2560 Stiffness of unspecified joint, not elsewhere classified: Secondary | ICD-10-CM | POA: Diagnosis not present

## 2016-02-11 DIAGNOSIS — G4733 Obstructive sleep apnea (adult) (pediatric): Secondary | ICD-10-CM | POA: Diagnosis not present

## 2016-02-12 DIAGNOSIS — M549 Dorsalgia, unspecified: Secondary | ICD-10-CM | POA: Diagnosis not present

## 2016-02-12 DIAGNOSIS — M2560 Stiffness of unspecified joint, not elsewhere classified: Secondary | ICD-10-CM | POA: Diagnosis not present

## 2016-02-17 DIAGNOSIS — F17200 Nicotine dependence, unspecified, uncomplicated: Secondary | ICD-10-CM | POA: Diagnosis not present

## 2016-02-17 DIAGNOSIS — F410 Panic disorder [episodic paroxysmal anxiety] without agoraphobia: Secondary | ICD-10-CM | POA: Diagnosis not present

## 2016-02-18 DIAGNOSIS — M2560 Stiffness of unspecified joint, not elsewhere classified: Secondary | ICD-10-CM | POA: Diagnosis not present

## 2016-02-18 DIAGNOSIS — M549 Dorsalgia, unspecified: Secondary | ICD-10-CM | POA: Diagnosis not present

## 2016-02-23 DIAGNOSIS — R062 Wheezing: Secondary | ICD-10-CM | POA: Diagnosis not present

## 2016-02-23 DIAGNOSIS — H66009 Acute suppurative otitis media without spontaneous rupture of ear drum, unspecified ear: Secondary | ICD-10-CM | POA: Diagnosis not present

## 2016-02-24 ENCOUNTER — Ambulatory Visit (INDEPENDENT_AMBULATORY_CARE_PROVIDER_SITE_OTHER): Payer: PRIVATE HEALTH INSURANCE | Admitting: Psychology

## 2016-02-24 DIAGNOSIS — F419 Anxiety disorder, unspecified: Secondary | ICD-10-CM | POA: Diagnosis not present

## 2016-02-24 DIAGNOSIS — F429 Obsessive-compulsive disorder, unspecified: Secondary | ICD-10-CM

## 2016-02-24 DIAGNOSIS — O906 Postpartum mood disturbance: Secondary | ICD-10-CM | POA: Diagnosis not present

## 2016-02-26 DIAGNOSIS — M2560 Stiffness of unspecified joint, not elsewhere classified: Secondary | ICD-10-CM | POA: Diagnosis not present

## 2016-02-26 DIAGNOSIS — M549 Dorsalgia, unspecified: Secondary | ICD-10-CM | POA: Diagnosis not present

## 2016-03-10 ENCOUNTER — Ambulatory Visit (INDEPENDENT_AMBULATORY_CARE_PROVIDER_SITE_OTHER): Payer: PRIVATE HEALTH INSURANCE | Admitting: Psychology

## 2016-03-10 DIAGNOSIS — O906 Postpartum mood disturbance: Secondary | ICD-10-CM | POA: Diagnosis not present

## 2016-03-10 DIAGNOSIS — F411 Generalized anxiety disorder: Secondary | ICD-10-CM | POA: Diagnosis not present

## 2016-03-10 DIAGNOSIS — F429 Obsessive-compulsive disorder, unspecified: Secondary | ICD-10-CM | POA: Diagnosis not present

## 2016-03-31 ENCOUNTER — Ambulatory Visit (INDEPENDENT_AMBULATORY_CARE_PROVIDER_SITE_OTHER): Payer: PRIVATE HEALTH INSURANCE | Admitting: Psychology

## 2016-03-31 DIAGNOSIS — O906 Postpartum mood disturbance: Secondary | ICD-10-CM

## 2016-03-31 DIAGNOSIS — F411 Generalized anxiety disorder: Secondary | ICD-10-CM | POA: Diagnosis not present

## 2016-03-31 DIAGNOSIS — F429 Obsessive-compulsive disorder, unspecified: Secondary | ICD-10-CM | POA: Diagnosis not present

## 2016-04-21 ENCOUNTER — Ambulatory Visit: Payer: PRIVATE HEALTH INSURANCE | Admitting: Psychology

## 2016-05-05 ENCOUNTER — Ambulatory Visit (INDEPENDENT_AMBULATORY_CARE_PROVIDER_SITE_OTHER): Payer: PRIVATE HEALTH INSURANCE | Admitting: Psychology

## 2016-05-05 DIAGNOSIS — F411 Generalized anxiety disorder: Secondary | ICD-10-CM

## 2016-05-05 DIAGNOSIS — F429 Obsessive-compulsive disorder, unspecified: Secondary | ICD-10-CM

## 2016-05-19 ENCOUNTER — Ambulatory Visit (INDEPENDENT_AMBULATORY_CARE_PROVIDER_SITE_OTHER): Payer: PRIVATE HEALTH INSURANCE | Admitting: Psychology

## 2016-05-19 DIAGNOSIS — F429 Obsessive-compulsive disorder, unspecified: Secondary | ICD-10-CM | POA: Diagnosis not present

## 2016-05-19 DIAGNOSIS — F411 Generalized anxiety disorder: Secondary | ICD-10-CM | POA: Diagnosis not present

## 2016-05-30 DIAGNOSIS — L03039 Cellulitis of unspecified toe: Secondary | ICD-10-CM | POA: Diagnosis not present

## 2016-06-09 ENCOUNTER — Ambulatory Visit (INDEPENDENT_AMBULATORY_CARE_PROVIDER_SITE_OTHER): Payer: PRIVATE HEALTH INSURANCE | Admitting: Psychology

## 2016-06-09 DIAGNOSIS — F429 Obsessive-compulsive disorder, unspecified: Secondary | ICD-10-CM

## 2016-06-09 DIAGNOSIS — F411 Generalized anxiety disorder: Secondary | ICD-10-CM | POA: Diagnosis not present

## 2016-07-07 ENCOUNTER — Ambulatory Visit (INDEPENDENT_AMBULATORY_CARE_PROVIDER_SITE_OTHER): Payer: PRIVATE HEALTH INSURANCE | Admitting: Psychology

## 2016-07-07 DIAGNOSIS — F429 Obsessive-compulsive disorder, unspecified: Secondary | ICD-10-CM | POA: Diagnosis not present

## 2016-07-07 DIAGNOSIS — F411 Generalized anxiety disorder: Secondary | ICD-10-CM

## 2016-07-22 DIAGNOSIS — R7309 Other abnormal glucose: Secondary | ICD-10-CM | POA: Insufficient documentation

## 2016-07-22 DIAGNOSIS — R5382 Chronic fatigue, unspecified: Secondary | ICD-10-CM | POA: Diagnosis not present

## 2016-07-22 DIAGNOSIS — D510 Vitamin B12 deficiency anemia due to intrinsic factor deficiency: Secondary | ICD-10-CM | POA: Diagnosis not present

## 2016-07-22 DIAGNOSIS — E039 Hypothyroidism, unspecified: Secondary | ICD-10-CM | POA: Diagnosis not present

## 2016-07-22 DIAGNOSIS — R799 Abnormal finding of blood chemistry, unspecified: Secondary | ICD-10-CM | POA: Diagnosis not present

## 2016-07-22 DIAGNOSIS — E559 Vitamin D deficiency, unspecified: Secondary | ICD-10-CM | POA: Diagnosis not present

## 2016-07-22 HISTORY — DX: Other abnormal glucose: R73.09

## 2016-07-25 DIAGNOSIS — R071 Chest pain on breathing: Secondary | ICD-10-CM | POA: Diagnosis not present

## 2016-07-25 DIAGNOSIS — F411 Generalized anxiety disorder: Secondary | ICD-10-CM | POA: Diagnosis not present

## 2016-07-28 ENCOUNTER — Ambulatory Visit (INDEPENDENT_AMBULATORY_CARE_PROVIDER_SITE_OTHER): Payer: PRIVATE HEALTH INSURANCE | Admitting: Psychology

## 2016-07-28 DIAGNOSIS — F429 Obsessive-compulsive disorder, unspecified: Secondary | ICD-10-CM | POA: Diagnosis not present

## 2016-07-28 DIAGNOSIS — F411 Generalized anxiety disorder: Secondary | ICD-10-CM

## 2016-08-11 ENCOUNTER — Ambulatory Visit (INDEPENDENT_AMBULATORY_CARE_PROVIDER_SITE_OTHER): Payer: PRIVATE HEALTH INSURANCE | Admitting: Psychology

## 2016-08-11 DIAGNOSIS — F429 Obsessive-compulsive disorder, unspecified: Secondary | ICD-10-CM | POA: Diagnosis not present

## 2016-08-13 DIAGNOSIS — L918 Other hypertrophic disorders of the skin: Secondary | ICD-10-CM | POA: Diagnosis not present

## 2016-08-20 DIAGNOSIS — Z113 Encounter for screening for infections with a predominantly sexual mode of transmission: Secondary | ICD-10-CM | POA: Diagnosis not present

## 2016-08-20 DIAGNOSIS — Z6841 Body Mass Index (BMI) 40.0 and over, adult: Secondary | ICD-10-CM | POA: Diagnosis not present

## 2016-08-20 DIAGNOSIS — Z124 Encounter for screening for malignant neoplasm of cervix: Secondary | ICD-10-CM | POA: Diagnosis not present

## 2016-08-20 DIAGNOSIS — Z01419 Encounter for gynecological examination (general) (routine) without abnormal findings: Secondary | ICD-10-CM | POA: Diagnosis not present

## 2016-08-21 DIAGNOSIS — N926 Irregular menstruation, unspecified: Secondary | ICD-10-CM | POA: Insufficient documentation

## 2016-08-21 DIAGNOSIS — Z6841 Body Mass Index (BMI) 40.0 and over, adult: Secondary | ICD-10-CM

## 2016-08-21 DIAGNOSIS — F17200 Nicotine dependence, unspecified, uncomplicated: Secondary | ICD-10-CM

## 2016-08-21 HISTORY — DX: Irregular menstruation, unspecified: N92.6

## 2016-08-21 HISTORY — DX: Body mass index (BMI) 40.0-44.9, adult: E66.01

## 2016-08-21 HISTORY — DX: Nicotine dependence, unspecified, uncomplicated: F17.200

## 2016-08-25 ENCOUNTER — Ambulatory Visit (INDEPENDENT_AMBULATORY_CARE_PROVIDER_SITE_OTHER): Payer: PRIVATE HEALTH INSURANCE | Admitting: Psychology

## 2016-08-25 DIAGNOSIS — F429 Obsessive-compulsive disorder, unspecified: Secondary | ICD-10-CM

## 2016-08-25 DIAGNOSIS — F17200 Nicotine dependence, unspecified, uncomplicated: Secondary | ICD-10-CM | POA: Diagnosis not present

## 2016-08-25 DIAGNOSIS — F410 Panic disorder [episodic paroxysmal anxiety] without agoraphobia: Secondary | ICD-10-CM | POA: Diagnosis not present

## 2016-09-19 DIAGNOSIS — G501 Atypical facial pain: Secondary | ICD-10-CM | POA: Diagnosis not present

## 2016-09-25 ENCOUNTER — Ambulatory Visit (INDEPENDENT_AMBULATORY_CARE_PROVIDER_SITE_OTHER): Payer: PRIVATE HEALTH INSURANCE | Admitting: Psychology

## 2016-09-25 DIAGNOSIS — F429 Obsessive-compulsive disorder, unspecified: Secondary | ICD-10-CM

## 2016-10-23 DIAGNOSIS — N911 Secondary amenorrhea: Secondary | ICD-10-CM | POA: Diagnosis not present

## 2016-10-23 DIAGNOSIS — E282 Polycystic ovarian syndrome: Secondary | ICD-10-CM | POA: Diagnosis not present

## 2016-10-26 ENCOUNTER — Ambulatory Visit: Payer: Self-pay | Admitting: Psychology

## 2016-12-07 ENCOUNTER — Ambulatory Visit: Payer: PRIVATE HEALTH INSURANCE | Admitting: Psychology

## 2016-12-08 ENCOUNTER — Ambulatory Visit (INDEPENDENT_AMBULATORY_CARE_PROVIDER_SITE_OTHER): Payer: PRIVATE HEALTH INSURANCE | Admitting: Psychology

## 2016-12-08 DIAGNOSIS — F429 Obsessive-compulsive disorder, unspecified: Secondary | ICD-10-CM | POA: Diagnosis not present

## 2016-12-21 ENCOUNTER — Ambulatory Visit: Payer: PRIVATE HEALTH INSURANCE | Admitting: Psychology

## 2016-12-31 DIAGNOSIS — Z6841 Body Mass Index (BMI) 40.0 and over, adult: Secondary | ICD-10-CM | POA: Diagnosis not present

## 2016-12-31 DIAGNOSIS — E785 Hyperlipidemia, unspecified: Secondary | ICD-10-CM | POA: Diagnosis not present

## 2016-12-31 DIAGNOSIS — F17200 Nicotine dependence, unspecified, uncomplicated: Secondary | ICD-10-CM | POA: Diagnosis not present

## 2016-12-31 DIAGNOSIS — I10 Essential (primary) hypertension: Secondary | ICD-10-CM | POA: Diagnosis not present

## 2017-01-04 ENCOUNTER — Ambulatory Visit (INDEPENDENT_AMBULATORY_CARE_PROVIDER_SITE_OTHER): Payer: PRIVATE HEALTH INSURANCE | Admitting: Psychology

## 2017-01-04 DIAGNOSIS — F411 Generalized anxiety disorder: Secondary | ICD-10-CM | POA: Diagnosis not present

## 2017-01-04 DIAGNOSIS — F429 Obsessive-compulsive disorder, unspecified: Secondary | ICD-10-CM | POA: Diagnosis not present

## 2017-01-25 DIAGNOSIS — E785 Hyperlipidemia, unspecified: Secondary | ICD-10-CM | POA: Diagnosis not present

## 2017-01-25 DIAGNOSIS — I10 Essential (primary) hypertension: Secondary | ICD-10-CM | POA: Diagnosis not present

## 2017-01-25 DIAGNOSIS — F17200 Nicotine dependence, unspecified, uncomplicated: Secondary | ICD-10-CM | POA: Diagnosis not present

## 2017-01-25 DIAGNOSIS — Z6841 Body Mass Index (BMI) 40.0 and over, adult: Secondary | ICD-10-CM | POA: Diagnosis not present

## 2017-01-26 ENCOUNTER — Ambulatory Visit: Payer: PRIVATE HEALTH INSURANCE | Admitting: Psychology

## 2017-02-01 ENCOUNTER — Ambulatory Visit (INDEPENDENT_AMBULATORY_CARE_PROVIDER_SITE_OTHER): Payer: PRIVATE HEALTH INSURANCE | Admitting: Psychology

## 2017-02-01 DIAGNOSIS — F429 Obsessive-compulsive disorder, unspecified: Secondary | ICD-10-CM

## 2017-02-01 DIAGNOSIS — F411 Generalized anxiety disorder: Secondary | ICD-10-CM | POA: Diagnosis not present

## 2017-02-22 ENCOUNTER — Ambulatory Visit: Payer: PRIVATE HEALTH INSURANCE | Admitting: Psychology

## 2017-03-11 DIAGNOSIS — Z6841 Body Mass Index (BMI) 40.0 and over, adult: Secondary | ICD-10-CM | POA: Diagnosis not present

## 2017-03-11 DIAGNOSIS — F17200 Nicotine dependence, unspecified, uncomplicated: Secondary | ICD-10-CM | POA: Diagnosis not present

## 2017-03-11 DIAGNOSIS — I10 Essential (primary) hypertension: Secondary | ICD-10-CM | POA: Diagnosis not present

## 2017-03-22 ENCOUNTER — Ambulatory Visit: Payer: PRIVATE HEALTH INSURANCE | Admitting: Psychology

## 2017-03-25 DIAGNOSIS — R69 Illness, unspecified: Secondary | ICD-10-CM | POA: Diagnosis not present

## 2017-03-25 DIAGNOSIS — R300 Dysuria: Secondary | ICD-10-CM | POA: Diagnosis not present

## 2017-03-25 DIAGNOSIS — Z6841 Body Mass Index (BMI) 40.0 and over, adult: Secondary | ICD-10-CM | POA: Diagnosis not present

## 2017-03-29 DIAGNOSIS — L820 Inflamed seborrheic keratosis: Secondary | ICD-10-CM | POA: Diagnosis not present

## 2017-03-29 DIAGNOSIS — L918 Other hypertrophic disorders of the skin: Secondary | ICD-10-CM | POA: Diagnosis not present

## 2017-03-29 DIAGNOSIS — L578 Other skin changes due to chronic exposure to nonionizing radiation: Secondary | ICD-10-CM | POA: Diagnosis not present

## 2017-03-29 DIAGNOSIS — L821 Other seborrheic keratosis: Secondary | ICD-10-CM | POA: Diagnosis not present

## 2017-04-19 ENCOUNTER — Ambulatory Visit (INDEPENDENT_AMBULATORY_CARE_PROVIDER_SITE_OTHER): Payer: PRIVATE HEALTH INSURANCE | Admitting: Psychology

## 2017-04-19 DIAGNOSIS — F429 Obsessive-compulsive disorder, unspecified: Secondary | ICD-10-CM | POA: Diagnosis not present

## 2017-04-19 DIAGNOSIS — F411 Generalized anxiety disorder: Secondary | ICD-10-CM | POA: Diagnosis not present

## 2017-05-03 DIAGNOSIS — Z6841 Body Mass Index (BMI) 40.0 and over, adult: Secondary | ICD-10-CM | POA: Diagnosis not present

## 2017-05-03 DIAGNOSIS — N839 Noninflammatory disorder of ovary, fallopian tube and broad ligament, unspecified: Secondary | ICD-10-CM | POA: Diagnosis not present

## 2017-05-03 DIAGNOSIS — Z98891 History of uterine scar from previous surgery: Secondary | ICD-10-CM | POA: Diagnosis not present

## 2017-05-04 DIAGNOSIS — L918 Other hypertrophic disorders of the skin: Secondary | ICD-10-CM | POA: Diagnosis not present

## 2017-05-10 DIAGNOSIS — N839 Noninflammatory disorder of ovary, fallopian tube and broad ligament, unspecified: Secondary | ICD-10-CM | POA: Diagnosis not present

## 2017-05-17 ENCOUNTER — Ambulatory Visit: Payer: Self-pay | Admitting: Psychology

## 2017-05-20 ENCOUNTER — Ambulatory Visit (INDEPENDENT_AMBULATORY_CARE_PROVIDER_SITE_OTHER): Payer: PRIVATE HEALTH INSURANCE | Admitting: Psychology

## 2017-05-20 DIAGNOSIS — F411 Generalized anxiety disorder: Secondary | ICD-10-CM

## 2017-05-20 DIAGNOSIS — F422 Mixed obsessional thoughts and acts: Secondary | ICD-10-CM

## 2017-05-31 ENCOUNTER — Ambulatory Visit: Payer: PRIVATE HEALTH INSURANCE | Admitting: Psychology

## 2017-06-08 DIAGNOSIS — Z6841 Body Mass Index (BMI) 40.0 and over, adult: Secondary | ICD-10-CM | POA: Diagnosis not present

## 2017-06-08 DIAGNOSIS — E282 Polycystic ovarian syndrome: Secondary | ICD-10-CM | POA: Diagnosis not present

## 2017-06-08 DIAGNOSIS — R739 Hyperglycemia, unspecified: Secondary | ICD-10-CM | POA: Diagnosis not present

## 2017-06-21 ENCOUNTER — Ambulatory Visit: Payer: PRIVATE HEALTH INSURANCE | Admitting: Psychology

## 2017-06-26 DIAGNOSIS — F17210 Nicotine dependence, cigarettes, uncomplicated: Secondary | ICD-10-CM | POA: Diagnosis not present

## 2017-06-26 DIAGNOSIS — E119 Type 2 diabetes mellitus without complications: Secondary | ICD-10-CM | POA: Diagnosis not present

## 2017-06-26 DIAGNOSIS — Z6841 Body Mass Index (BMI) 40.0 and over, adult: Secondary | ICD-10-CM | POA: Diagnosis not present

## 2017-06-26 DIAGNOSIS — I10 Essential (primary) hypertension: Secondary | ICD-10-CM | POA: Diagnosis not present

## 2017-06-26 DIAGNOSIS — Z7984 Long term (current) use of oral hypoglycemic drugs: Secondary | ICD-10-CM | POA: Diagnosis not present

## 2017-06-26 DIAGNOSIS — I493 Ventricular premature depolarization: Secondary | ICD-10-CM | POA: Diagnosis not present

## 2017-06-26 DIAGNOSIS — R002 Palpitations: Secondary | ICD-10-CM | POA: Diagnosis not present

## 2017-06-26 DIAGNOSIS — Z79899 Other long term (current) drug therapy: Secondary | ICD-10-CM | POA: Diagnosis not present

## 2017-06-30 DIAGNOSIS — I10 Essential (primary) hypertension: Secondary | ICD-10-CM | POA: Diagnosis not present

## 2017-06-30 DIAGNOSIS — K760 Fatty (change of) liver, not elsewhere classified: Secondary | ICD-10-CM | POA: Diagnosis not present

## 2017-06-30 DIAGNOSIS — N132 Hydronephrosis with renal and ureteral calculous obstruction: Secondary | ICD-10-CM | POA: Diagnosis not present

## 2017-06-30 DIAGNOSIS — F17210 Nicotine dependence, cigarettes, uncomplicated: Secondary | ICD-10-CM | POA: Diagnosis not present

## 2017-06-30 DIAGNOSIS — N130 Hydronephrosis with ureteropelvic junction obstruction: Secondary | ICD-10-CM | POA: Diagnosis not present

## 2017-06-30 DIAGNOSIS — R000 Tachycardia, unspecified: Secondary | ICD-10-CM | POA: Diagnosis not present

## 2017-06-30 DIAGNOSIS — E119 Type 2 diabetes mellitus without complications: Secondary | ICD-10-CM | POA: Diagnosis not present

## 2017-06-30 DIAGNOSIS — R509 Fever, unspecified: Secondary | ICD-10-CM | POA: Diagnosis not present

## 2017-06-30 DIAGNOSIS — M549 Dorsalgia, unspecified: Secondary | ICD-10-CM | POA: Diagnosis not present

## 2017-06-30 DIAGNOSIS — N12 Tubulo-interstitial nephritis, not specified as acute or chronic: Secondary | ICD-10-CM | POA: Diagnosis not present

## 2017-06-30 DIAGNOSIS — N200 Calculus of kidney: Secondary | ICD-10-CM | POA: Diagnosis not present

## 2017-06-30 DIAGNOSIS — R51 Headache: Secondary | ICD-10-CM | POA: Diagnosis not present

## 2017-06-30 DIAGNOSIS — J0100 Acute maxillary sinusitis, unspecified: Secondary | ICD-10-CM | POA: Diagnosis not present

## 2017-06-30 DIAGNOSIS — R319 Hematuria, unspecified: Secondary | ICD-10-CM | POA: Diagnosis not present

## 2017-06-30 IMAGING — US US ABDOMEN COMPLETE
1 series · 14 of 25 positions shown · non-contrast
Comparison: Ultrasound dated 02/07/2014

CLINICAL DATA: 28-year-old female with upper abdominal pain

EXAM:
ULTRASOUND ABDOMEN COMPLETE

[Series 1: us abdomen complete · 0.30mm/px · 14 of 82 slices shown]
[im 1/82]
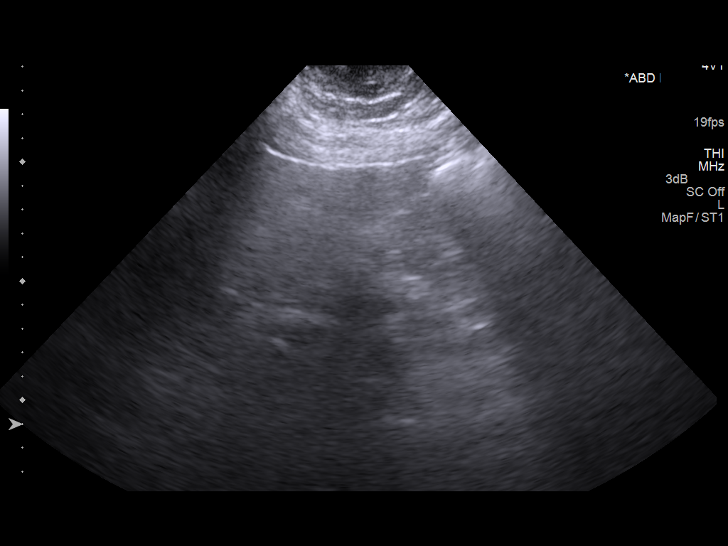
[im 7/82]
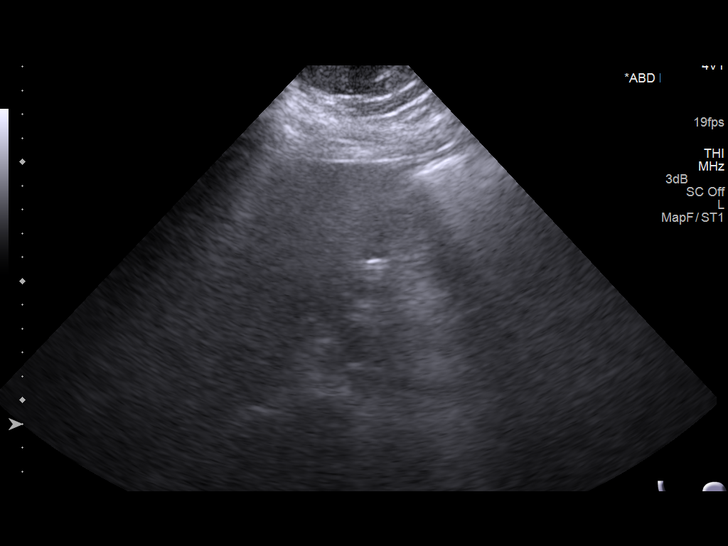
[im 14/82]
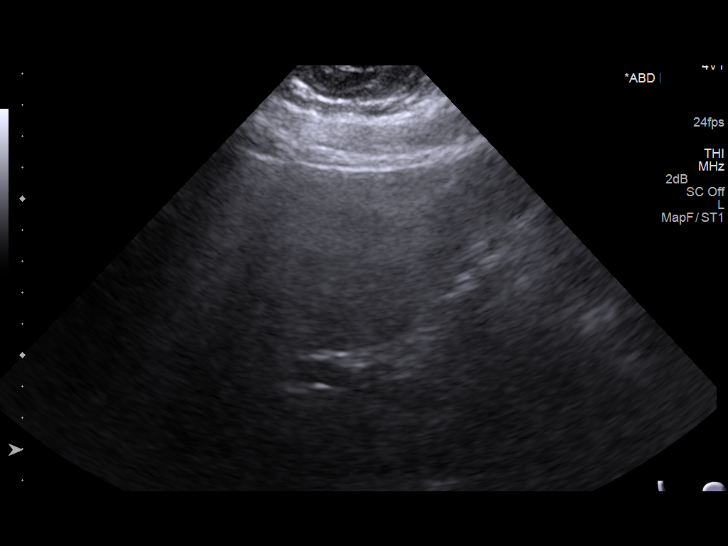
[im 21/82]
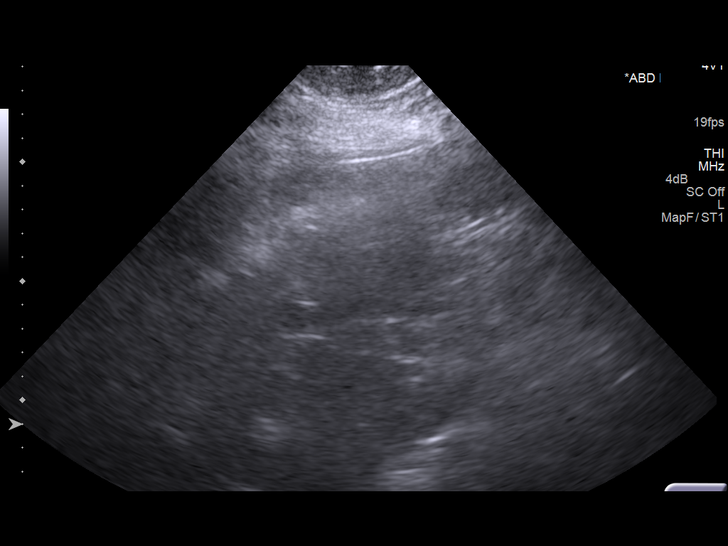
[im 28/82]
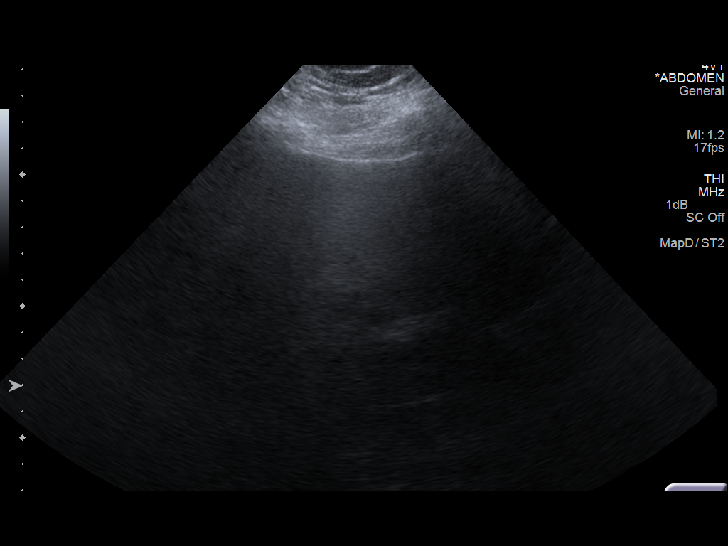
[im 31/82]
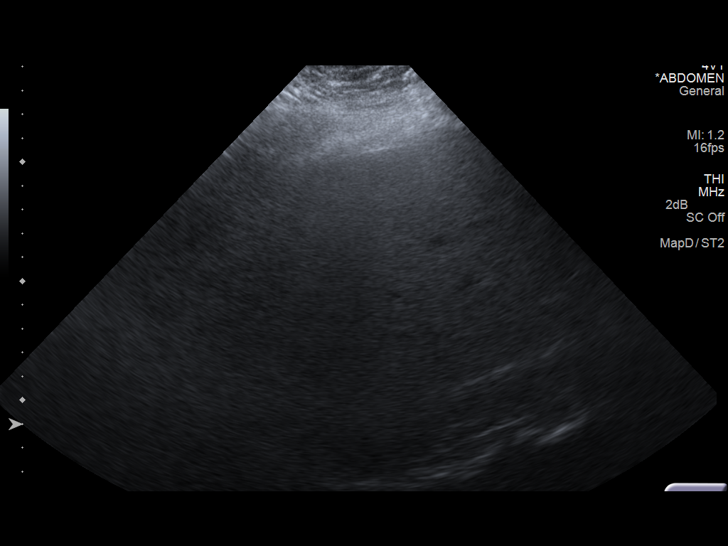
[im 38/82]
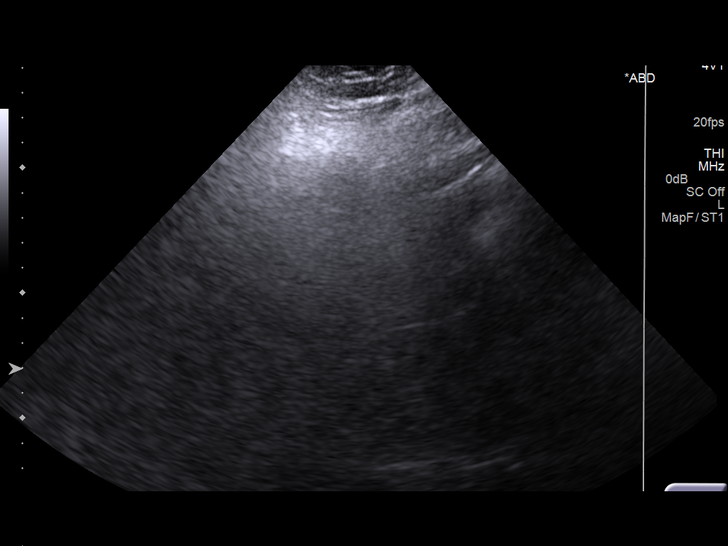
[im 44/82]
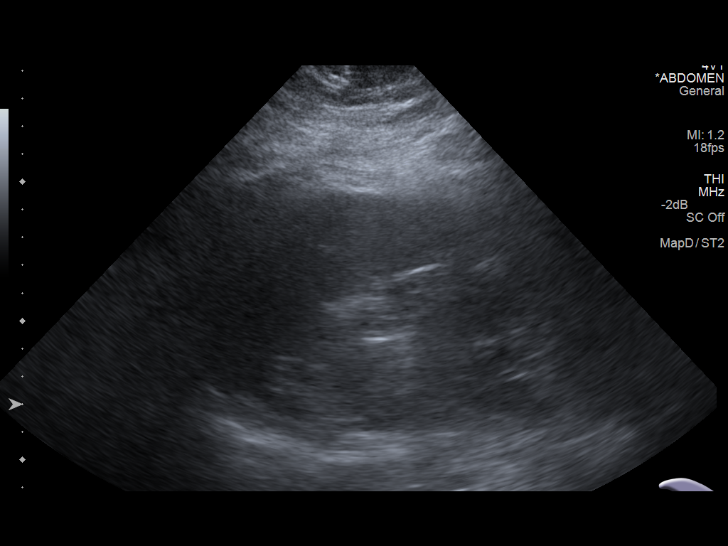
[im 51/82]
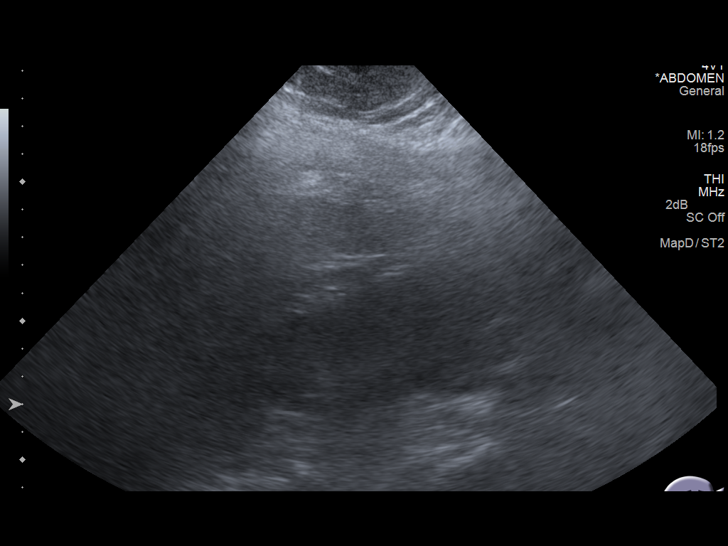
[im 55/82]
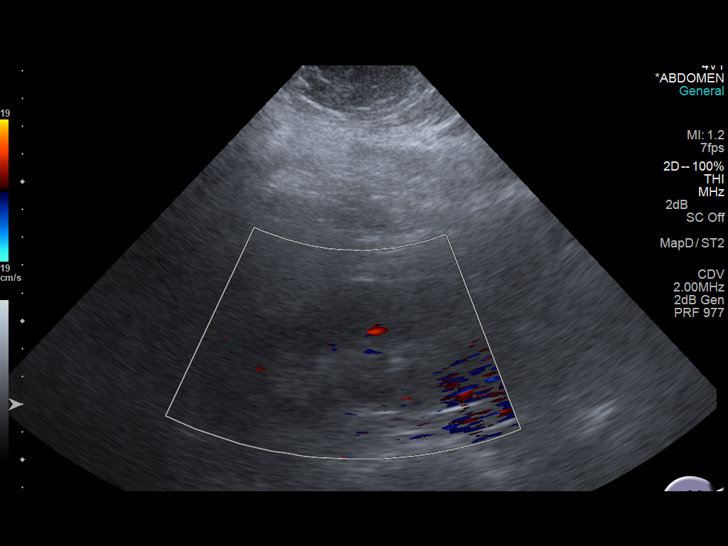
[im 61/82]
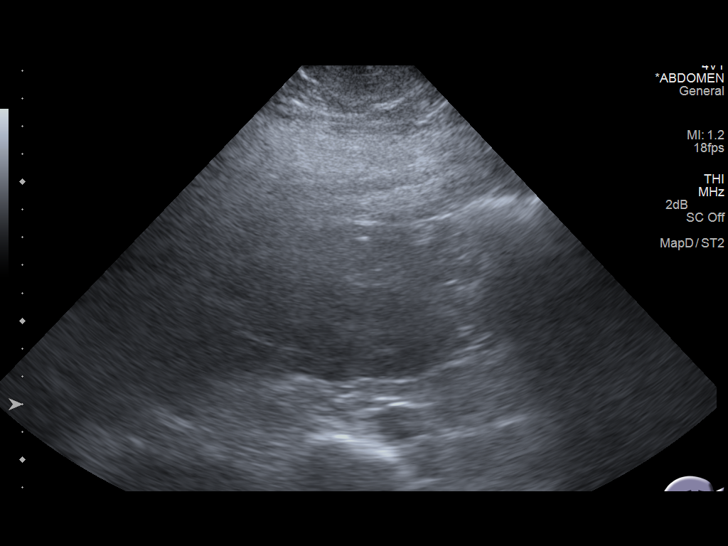
[im 68/82]
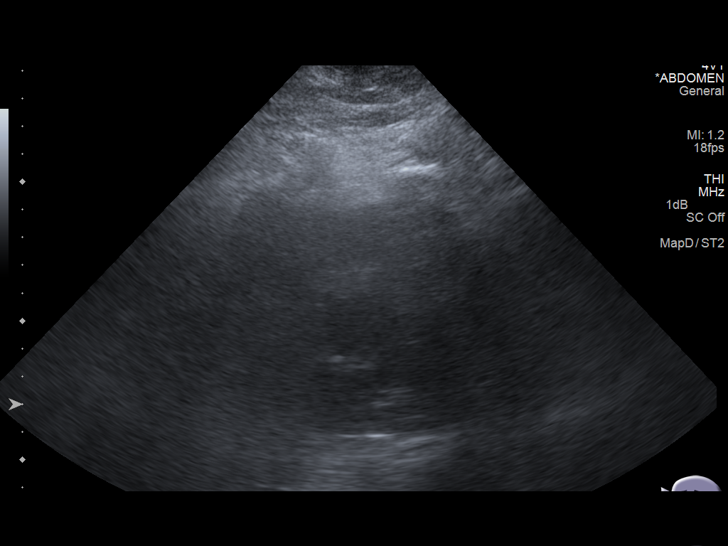
[im 75/82]
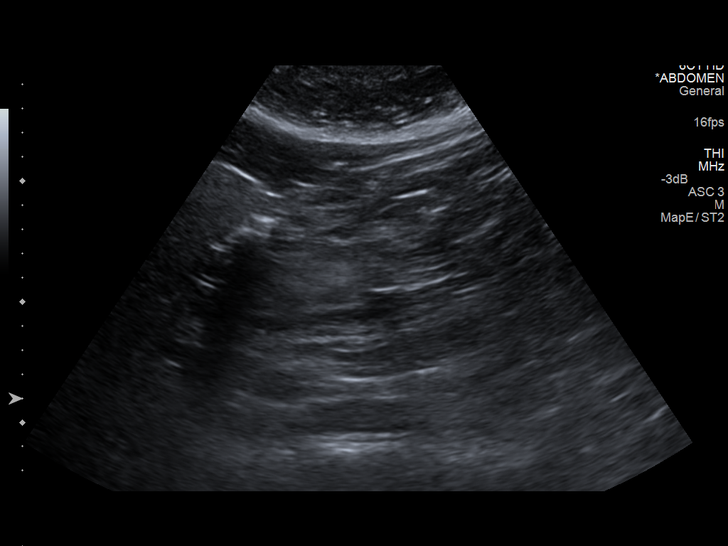
[im 82/82]
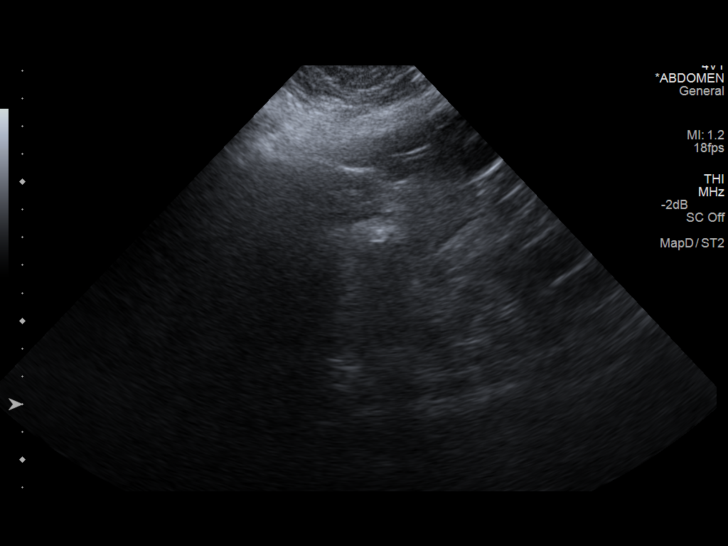

[14 of 25 positions shown; findings below may reference images not displayed]

FINDINGS: Evaluation is limited due to patient's body habitus.

Gallbladder: The gallbladder is filled with stones with sonographic
appearance of wall echo shadow. There is no pericholecystic fluid.
No tenderness was elicited over the gallbladder area during
scanning.

Common bile duct: Diameter: 3 mm

Liver: The liver appears echogenic and difficult to penetrate by
ultrasound, likely due to patient's body habitus and a degree of
hepatic steatosis.

IVC: Not well seen.

Pancreas: Not visualized

Spleen: Size and appearance within normal limits.

Right Kidney: Length: 13 cm. Echogenicity within normal limits. No
mass or hydronephrosis visualized.

Left Kidney: Length: 13 cm. Echogenicity within normal limits. No
mass or hydronephrosis visualized.

Abdominal aorta: Unremarkable as visualized.

Other findings: None.
IMPRESSION: Cholelithiasis without sonographic evidence of acute cholecystitis.
A hepatobiliary scintigraphy may provide better evaluation of the
gallbladder if an acute cholecystitis is clinically suspected.

## 2017-07-01 DIAGNOSIS — K760 Fatty (change of) liver, not elsewhere classified: Secondary | ICD-10-CM | POA: Diagnosis not present

## 2017-07-01 DIAGNOSIS — F419 Anxiety disorder, unspecified: Secondary | ICD-10-CM | POA: Diagnosis not present

## 2017-07-01 DIAGNOSIS — E119 Type 2 diabetes mellitus without complications: Secondary | ICD-10-CM

## 2017-07-01 DIAGNOSIS — R319 Hematuria, unspecified: Secondary | ICD-10-CM | POA: Diagnosis not present

## 2017-07-01 DIAGNOSIS — K219 Gastro-esophageal reflux disease without esophagitis: Secondary | ICD-10-CM | POA: Diagnosis not present

## 2017-07-01 DIAGNOSIS — R51 Headache: Secondary | ICD-10-CM | POA: Diagnosis not present

## 2017-07-01 DIAGNOSIS — I10 Essential (primary) hypertension: Secondary | ICD-10-CM | POA: Diagnosis not present

## 2017-07-01 DIAGNOSIS — N390 Urinary tract infection, site not specified: Secondary | ICD-10-CM | POA: Diagnosis not present

## 2017-07-01 DIAGNOSIS — N200 Calculus of kidney: Secondary | ICD-10-CM

## 2017-07-01 DIAGNOSIS — R509 Fever, unspecified: Secondary | ICD-10-CM | POA: Diagnosis not present

## 2017-07-01 DIAGNOSIS — F329 Major depressive disorder, single episode, unspecified: Secondary | ICD-10-CM | POA: Diagnosis not present

## 2017-07-01 DIAGNOSIS — N132 Hydronephrosis with renal and ureteral calculous obstruction: Secondary | ICD-10-CM | POA: Diagnosis not present

## 2017-07-01 DIAGNOSIS — N136 Pyonephrosis: Secondary | ICD-10-CM | POA: Diagnosis not present

## 2017-07-01 DIAGNOSIS — N135 Crossing vessel and stricture of ureter without hydronephrosis: Secondary | ICD-10-CM | POA: Diagnosis not present

## 2017-07-01 DIAGNOSIS — M549 Dorsalgia, unspecified: Secondary | ICD-10-CM | POA: Diagnosis not present

## 2017-07-01 DIAGNOSIS — Z7984 Long term (current) use of oral hypoglycemic drugs: Secondary | ICD-10-CM | POA: Diagnosis not present

## 2017-07-01 DIAGNOSIS — R000 Tachycardia, unspecified: Secondary | ICD-10-CM | POA: Diagnosis not present

## 2017-07-01 DIAGNOSIS — F17210 Nicotine dependence, cigarettes, uncomplicated: Secondary | ICD-10-CM | POA: Diagnosis not present

## 2017-07-01 DIAGNOSIS — N12 Tubulo-interstitial nephritis, not specified as acute or chronic: Secondary | ICD-10-CM | POA: Diagnosis not present

## 2017-07-01 DIAGNOSIS — N10 Acute tubulo-interstitial nephritis: Secondary | ICD-10-CM | POA: Diagnosis not present

## 2017-07-01 DIAGNOSIS — A419 Sepsis, unspecified organism: Secondary | ICD-10-CM | POA: Diagnosis not present

## 2017-07-01 DIAGNOSIS — Z6841 Body Mass Index (BMI) 40.0 and over, adult: Secondary | ICD-10-CM | POA: Diagnosis not present

## 2017-07-01 DIAGNOSIS — Z87442 Personal history of urinary calculi: Secondary | ICD-10-CM | POA: Diagnosis not present

## 2017-07-01 DIAGNOSIS — Z79899 Other long term (current) drug therapy: Secondary | ICD-10-CM | POA: Diagnosis not present

## 2017-07-01 DIAGNOSIS — N201 Calculus of ureter: Secondary | ICD-10-CM | POA: Diagnosis not present

## 2017-07-01 HISTORY — DX: Calculus of kidney: N20.0

## 2017-07-01 HISTORY — DX: Type 2 diabetes mellitus without complications: E11.9

## 2017-07-01 HISTORY — DX: Essential (primary) hypertension: I10

## 2017-07-02 DIAGNOSIS — N200 Calculus of kidney: Secondary | ICD-10-CM | POA: Diagnosis not present

## 2017-07-02 DIAGNOSIS — A419 Sepsis, unspecified organism: Secondary | ICD-10-CM | POA: Diagnosis not present

## 2017-07-02 DIAGNOSIS — E119 Type 2 diabetes mellitus without complications: Secondary | ICD-10-CM | POA: Diagnosis not present

## 2017-07-02 DIAGNOSIS — N201 Calculus of ureter: Secondary | ICD-10-CM | POA: Diagnosis not present

## 2017-07-02 DIAGNOSIS — I10 Essential (primary) hypertension: Secondary | ICD-10-CM | POA: Diagnosis not present

## 2017-07-02 DIAGNOSIS — N390 Urinary tract infection, site not specified: Secondary | ICD-10-CM | POA: Diagnosis not present

## 2017-07-02 DIAGNOSIS — N12 Tubulo-interstitial nephritis, not specified as acute or chronic: Secondary | ICD-10-CM | POA: Diagnosis not present

## 2017-07-02 HISTORY — PX: URETERAL STENT PLACEMENT: SHX822

## 2017-07-05 DIAGNOSIS — R002 Palpitations: Secondary | ICD-10-CM | POA: Diagnosis not present

## 2017-07-05 DIAGNOSIS — I493 Ventricular premature depolarization: Secondary | ICD-10-CM | POA: Diagnosis not present

## 2017-07-07 DIAGNOSIS — N3000 Acute cystitis without hematuria: Secondary | ICD-10-CM | POA: Diagnosis not present

## 2017-07-07 DIAGNOSIS — N201 Calculus of ureter: Secondary | ICD-10-CM | POA: Diagnosis not present

## 2017-07-07 DIAGNOSIS — Z87442 Personal history of urinary calculi: Secondary | ICD-10-CM | POA: Diagnosis not present

## 2017-07-07 DIAGNOSIS — N200 Calculus of kidney: Secondary | ICD-10-CM | POA: Diagnosis not present

## 2017-07-12 ENCOUNTER — Ambulatory Visit (INDEPENDENT_AMBULATORY_CARE_PROVIDER_SITE_OTHER): Payer: PRIVATE HEALTH INSURANCE | Admitting: Psychology

## 2017-07-12 DIAGNOSIS — F429 Obsessive-compulsive disorder, unspecified: Secondary | ICD-10-CM | POA: Diagnosis not present

## 2017-07-12 DIAGNOSIS — F411 Generalized anxiety disorder: Secondary | ICD-10-CM

## 2017-07-13 DIAGNOSIS — R310 Gross hematuria: Secondary | ICD-10-CM | POA: Diagnosis not present

## 2017-07-13 DIAGNOSIS — N200 Calculus of kidney: Secondary | ICD-10-CM | POA: Diagnosis not present

## 2017-07-13 DIAGNOSIS — R102 Pelvic and perineal pain: Secondary | ICD-10-CM | POA: Diagnosis not present

## 2017-07-13 DIAGNOSIS — N3000 Acute cystitis without hematuria: Secondary | ICD-10-CM | POA: Diagnosis not present

## 2017-07-13 DIAGNOSIS — R109 Unspecified abdominal pain: Secondary | ICD-10-CM | POA: Diagnosis not present

## 2017-07-16 DIAGNOSIS — N202 Calculus of kidney with calculus of ureter: Secondary | ICD-10-CM | POA: Diagnosis not present

## 2017-07-16 DIAGNOSIS — N200 Calculus of kidney: Secondary | ICD-10-CM | POA: Diagnosis not present

## 2017-07-16 DIAGNOSIS — K219 Gastro-esophageal reflux disease without esophagitis: Secondary | ICD-10-CM | POA: Diagnosis not present

## 2017-07-16 DIAGNOSIS — I10 Essential (primary) hypertension: Secondary | ICD-10-CM | POA: Diagnosis not present

## 2017-07-16 DIAGNOSIS — E119 Type 2 diabetes mellitus without complications: Secondary | ICD-10-CM | POA: Diagnosis not present

## 2017-07-16 DIAGNOSIS — F17210 Nicotine dependence, cigarettes, uncomplicated: Secondary | ICD-10-CM | POA: Diagnosis not present

## 2017-07-22 DIAGNOSIS — N200 Calculus of kidney: Secondary | ICD-10-CM | POA: Diagnosis not present

## 2017-07-26 DIAGNOSIS — E282 Polycystic ovarian syndrome: Secondary | ICD-10-CM

## 2017-07-26 DIAGNOSIS — E8881 Metabolic syndrome: Secondary | ICD-10-CM | POA: Diagnosis not present

## 2017-07-26 DIAGNOSIS — Z6841 Body Mass Index (BMI) 40.0 and over, adult: Secondary | ICD-10-CM | POA: Diagnosis not present

## 2017-07-26 DIAGNOSIS — E88819 Insulin resistance, unspecified: Secondary | ICD-10-CM

## 2017-07-26 HISTORY — DX: Insulin resistance, unspecified: E88.819

## 2017-07-26 HISTORY — DX: Metabolic syndrome: E88.81

## 2017-07-26 HISTORY — DX: Polycystic ovarian syndrome: E28.2

## 2017-08-02 ENCOUNTER — Ambulatory Visit: Payer: Self-pay | Admitting: Psychology

## 2017-08-03 DIAGNOSIS — E282 Polycystic ovarian syndrome: Secondary | ICD-10-CM | POA: Diagnosis not present

## 2017-08-03 DIAGNOSIS — E669 Obesity, unspecified: Secondary | ICD-10-CM | POA: Diagnosis not present

## 2017-08-03 DIAGNOSIS — E1169 Type 2 diabetes mellitus with other specified complication: Secondary | ICD-10-CM | POA: Diagnosis not present

## 2017-08-03 HISTORY — DX: Obesity, unspecified: E11.69

## 2017-08-03 HISTORY — DX: Obesity, unspecified: E66.9

## 2017-08-23 ENCOUNTER — Ambulatory Visit (INDEPENDENT_AMBULATORY_CARE_PROVIDER_SITE_OTHER): Payer: PRIVATE HEALTH INSURANCE | Admitting: Psychology

## 2017-08-23 DIAGNOSIS — F411 Generalized anxiety disorder: Secondary | ICD-10-CM | POA: Diagnosis not present

## 2017-08-23 DIAGNOSIS — F429 Obsessive-compulsive disorder, unspecified: Secondary | ICD-10-CM | POA: Diagnosis not present

## 2017-09-13 ENCOUNTER — Ambulatory Visit: Payer: PRIVATE HEALTH INSURANCE | Admitting: Psychology

## 2017-09-29 DIAGNOSIS — N200 Calculus of kidney: Secondary | ICD-10-CM | POA: Diagnosis not present

## 2017-09-29 DIAGNOSIS — N201 Calculus of ureter: Secondary | ICD-10-CM | POA: Diagnosis not present

## 2017-09-30 DIAGNOSIS — F410 Panic disorder [episodic paroxysmal anxiety] without agoraphobia: Secondary | ICD-10-CM | POA: Diagnosis not present

## 2017-09-30 DIAGNOSIS — F17200 Nicotine dependence, unspecified, uncomplicated: Secondary | ICD-10-CM | POA: Diagnosis not present

## 2017-09-30 DIAGNOSIS — K219 Gastro-esophageal reflux disease without esophagitis: Secondary | ICD-10-CM | POA: Diagnosis not present

## 2017-09-30 DIAGNOSIS — I10 Essential (primary) hypertension: Secondary | ICD-10-CM | POA: Diagnosis not present

## 2017-10-01 DIAGNOSIS — Z3201 Encounter for pregnancy test, result positive: Secondary | ICD-10-CM | POA: Diagnosis not present

## 2017-10-01 DIAGNOSIS — Z6841 Body Mass Index (BMI) 40.0 and over, adult: Secondary | ICD-10-CM | POA: Diagnosis not present

## 2017-10-11 DIAGNOSIS — Z8742 Personal history of other diseases of the female genital tract: Secondary | ICD-10-CM | POA: Diagnosis not present

## 2017-10-14 DIAGNOSIS — O209 Hemorrhage in early pregnancy, unspecified: Secondary | ICD-10-CM | POA: Diagnosis not present

## 2017-10-14 DIAGNOSIS — R8271 Bacteriuria: Secondary | ICD-10-CM | POA: Diagnosis not present

## 2017-10-14 DIAGNOSIS — O26891 Other specified pregnancy related conditions, first trimester: Secondary | ICD-10-CM | POA: Diagnosis not present

## 2017-10-14 DIAGNOSIS — Z3A01 Less than 8 weeks gestation of pregnancy: Secondary | ICD-10-CM | POA: Diagnosis not present

## 2017-10-14 DIAGNOSIS — O2391 Unspecified genitourinary tract infection in pregnancy, first trimester: Secondary | ICD-10-CM | POA: Diagnosis not present

## 2017-10-14 DIAGNOSIS — Z87891 Personal history of nicotine dependence: Secondary | ICD-10-CM | POA: Diagnosis not present

## 2017-10-20 DIAGNOSIS — O209 Hemorrhage in early pregnancy, unspecified: Secondary | ICD-10-CM | POA: Diagnosis not present

## 2017-10-20 DIAGNOSIS — Z3A00 Weeks of gestation of pregnancy not specified: Secondary | ICD-10-CM | POA: Diagnosis not present

## 2017-10-25 ENCOUNTER — Ambulatory Visit (INDEPENDENT_AMBULATORY_CARE_PROVIDER_SITE_OTHER): Payer: PRIVATE HEALTH INSURANCE | Admitting: Psychology

## 2017-10-25 DIAGNOSIS — F411 Generalized anxiety disorder: Secondary | ICD-10-CM

## 2017-10-25 DIAGNOSIS — F429 Obsessive-compulsive disorder, unspecified: Secondary | ICD-10-CM

## 2017-10-27 DIAGNOSIS — O09299 Supervision of pregnancy with other poor reproductive or obstetric history, unspecified trimester: Secondary | ICD-10-CM | POA: Diagnosis not present

## 2017-10-27 DIAGNOSIS — Z3201 Encounter for pregnancy test, result positive: Secondary | ICD-10-CM | POA: Diagnosis not present

## 2017-10-27 DIAGNOSIS — O24919 Unspecified diabetes mellitus in pregnancy, unspecified trimester: Secondary | ICD-10-CM | POA: Diagnosis not present

## 2017-10-27 DIAGNOSIS — Z3689 Encounter for other specified antenatal screening: Secondary | ICD-10-CM | POA: Diagnosis not present

## 2017-10-27 DIAGNOSIS — Z3A08 8 weeks gestation of pregnancy: Secondary | ICD-10-CM | POA: Diagnosis not present

## 2017-10-27 DIAGNOSIS — Z98891 History of uterine scar from previous surgery: Secondary | ICD-10-CM

## 2017-10-27 DIAGNOSIS — O99211 Obesity complicating pregnancy, first trimester: Secondary | ICD-10-CM | POA: Diagnosis not present

## 2017-10-27 DIAGNOSIS — Z3A00 Weeks of gestation of pregnancy not specified: Secondary | ICD-10-CM | POA: Diagnosis not present

## 2017-10-27 DIAGNOSIS — O24911 Unspecified diabetes mellitus in pregnancy, first trimester: Secondary | ICD-10-CM | POA: Diagnosis not present

## 2017-10-27 HISTORY — DX: History of uterine scar from previous surgery: Z98.891

## 2017-11-12 DIAGNOSIS — E1169 Type 2 diabetes mellitus with other specified complication: Secondary | ICD-10-CM | POA: Diagnosis not present

## 2017-11-12 DIAGNOSIS — Z3A08 8 weeks gestation of pregnancy: Secondary | ICD-10-CM | POA: Diagnosis not present

## 2017-11-12 DIAGNOSIS — E669 Obesity, unspecified: Secondary | ICD-10-CM | POA: Diagnosis not present

## 2017-11-12 DIAGNOSIS — O09299 Supervision of pregnancy with other poor reproductive or obstetric history, unspecified trimester: Secondary | ICD-10-CM | POA: Diagnosis not present

## 2017-11-12 DIAGNOSIS — Z3A10 10 weeks gestation of pregnancy: Secondary | ICD-10-CM | POA: Diagnosis not present

## 2017-11-12 DIAGNOSIS — Z3689 Encounter for other specified antenatal screening: Secondary | ICD-10-CM | POA: Diagnosis not present

## 2017-11-15 ENCOUNTER — Ambulatory Visit: Payer: Self-pay | Admitting: Psychology

## 2017-11-25 DIAGNOSIS — E282 Polycystic ovarian syndrome: Secondary | ICD-10-CM | POA: Diagnosis not present

## 2017-11-25 DIAGNOSIS — E669 Obesity, unspecified: Secondary | ICD-10-CM | POA: Diagnosis not present

## 2017-11-25 DIAGNOSIS — O24811 Other pre-existing diabetes mellitus in pregnancy, first trimester: Secondary | ICD-10-CM | POA: Diagnosis not present

## 2017-11-25 DIAGNOSIS — Z3A12 12 weeks gestation of pregnancy: Secondary | ICD-10-CM | POA: Diagnosis not present

## 2017-11-25 DIAGNOSIS — E1169 Type 2 diabetes mellitus with other specified complication: Secondary | ICD-10-CM | POA: Diagnosis not present

## 2017-11-26 DIAGNOSIS — Z3A13 13 weeks gestation of pregnancy: Secondary | ICD-10-CM | POA: Diagnosis not present

## 2017-11-26 DIAGNOSIS — Z3A12 12 weeks gestation of pregnancy: Secondary | ICD-10-CM | POA: Diagnosis not present

## 2017-11-26 DIAGNOSIS — Z3682 Encounter for antenatal screening for nuchal translucency: Secondary | ICD-10-CM | POA: Diagnosis not present

## 2017-12-06 ENCOUNTER — Ambulatory Visit: Payer: Self-pay | Admitting: Psychology

## 2017-12-06 ENCOUNTER — Ambulatory Visit: Payer: PRIVATE HEALTH INSURANCE | Admitting: Psychology

## 2017-12-13 DIAGNOSIS — E119 Type 2 diabetes mellitus without complications: Secondary | ICD-10-CM | POA: Diagnosis not present

## 2017-12-13 DIAGNOSIS — Z794 Long term (current) use of insulin: Secondary | ICD-10-CM | POA: Diagnosis not present

## 2017-12-13 DIAGNOSIS — O24812 Other pre-existing diabetes mellitus in pregnancy, second trimester: Secondary | ICD-10-CM | POA: Diagnosis not present

## 2017-12-13 DIAGNOSIS — Z3A15 15 weeks gestation of pregnancy: Secondary | ICD-10-CM | POA: Diagnosis not present

## 2017-12-13 DIAGNOSIS — O24112 Pre-existing diabetes mellitus, type 2, in pregnancy, second trimester: Secondary | ICD-10-CM | POA: Diagnosis not present

## 2017-12-14 DIAGNOSIS — Z794 Long term (current) use of insulin: Secondary | ICD-10-CM | POA: Diagnosis not present

## 2017-12-14 DIAGNOSIS — Z3A15 15 weeks gestation of pregnancy: Secondary | ICD-10-CM | POA: Diagnosis not present

## 2017-12-14 DIAGNOSIS — E119 Type 2 diabetes mellitus without complications: Secondary | ICD-10-CM | POA: Diagnosis not present

## 2017-12-14 DIAGNOSIS — O24112 Pre-existing diabetes mellitus, type 2, in pregnancy, second trimester: Secondary | ICD-10-CM | POA: Diagnosis not present

## 2017-12-14 DIAGNOSIS — O24812 Other pre-existing diabetes mellitus in pregnancy, second trimester: Secondary | ICD-10-CM | POA: Diagnosis not present

## 2017-12-21 DIAGNOSIS — Z3A18 18 weeks gestation of pregnancy: Secondary | ICD-10-CM | POA: Diagnosis not present

## 2017-12-26 DIAGNOSIS — R772 Abnormality of alphafetoprotein: Secondary | ICD-10-CM

## 2017-12-26 HISTORY — DX: Abnormality of alphafetoprotein: R77.2

## 2017-12-27 ENCOUNTER — Ambulatory Visit: Payer: PRIVATE HEALTH INSURANCE | Admitting: Psychology

## 2018-01-05 DIAGNOSIS — F17200 Nicotine dependence, unspecified, uncomplicated: Secondary | ICD-10-CM | POA: Diagnosis not present

## 2018-01-05 DIAGNOSIS — F410 Panic disorder [episodic paroxysmal anxiety] without agoraphobia: Secondary | ICD-10-CM | POA: Diagnosis not present

## 2018-01-05 DIAGNOSIS — Z6841 Body Mass Index (BMI) 40.0 and over, adult: Secondary | ICD-10-CM | POA: Diagnosis not present

## 2018-01-07 DIAGNOSIS — Z3689 Encounter for other specified antenatal screening: Secondary | ICD-10-CM | POA: Diagnosis not present

## 2018-01-07 DIAGNOSIS — Z3A18 18 weeks gestation of pregnancy: Secondary | ICD-10-CM | POA: Diagnosis not present

## 2018-01-11 ENCOUNTER — Other Ambulatory Visit (HOSPITAL_COMMUNITY): Payer: Self-pay | Admitting: Obstetrics and Gynecology

## 2018-01-11 DIAGNOSIS — O289 Unspecified abnormal findings on antenatal screening of mother: Secondary | ICD-10-CM

## 2018-01-12 ENCOUNTER — Encounter (HOSPITAL_COMMUNITY): Payer: Self-pay

## 2018-01-13 ENCOUNTER — Other Ambulatory Visit (HOSPITAL_COMMUNITY): Payer: Self-pay | Admitting: Obstetrics and Gynecology

## 2018-01-13 ENCOUNTER — Other Ambulatory Visit (HOSPITAL_COMMUNITY): Payer: Self-pay | Admitting: *Deleted

## 2018-01-13 ENCOUNTER — Ambulatory Visit (HOSPITAL_COMMUNITY)
Admission: RE | Admit: 2018-01-13 | Discharge: 2018-01-13 | Disposition: A | Discharge status: Home / Self Care | Payer: BLUE CROSS/BLUE SHIELD | Source: Ambulatory Visit | Attending: Obstetrics and Gynecology | Admitting: Obstetrics and Gynecology

## 2018-01-13 ENCOUNTER — Encounter (HOSPITAL_COMMUNITY): Payer: Self-pay

## 2018-01-13 DIAGNOSIS — O24112 Pre-existing diabetes mellitus, type 2, in pregnancy, second trimester: Secondary | ICD-10-CM | POA: Diagnosis not present

## 2018-01-13 DIAGNOSIS — O10012 Pre-existing essential hypertension complicating pregnancy, second trimester: Secondary | ICD-10-CM | POA: Diagnosis not present

## 2018-01-13 DIAGNOSIS — O09292 Supervision of pregnancy with other poor reproductive or obstetric history, second trimester: Secondary | ICD-10-CM | POA: Diagnosis not present

## 2018-01-13 DIAGNOSIS — O09899 Supervision of other high risk pregnancies, unspecified trimester: Secondary | ICD-10-CM

## 2018-01-13 DIAGNOSIS — O162 Unspecified maternal hypertension, second trimester: Secondary | ICD-10-CM

## 2018-01-13 DIAGNOSIS — Z315 Encounter for genetic counseling: Secondary | ICD-10-CM | POA: Insufficient documentation

## 2018-01-13 DIAGNOSIS — O24912 Unspecified diabetes mellitus in pregnancy, second trimester: Secondary | ICD-10-CM

## 2018-01-13 DIAGNOSIS — O09219 Supervision of pregnancy with history of pre-term labor, unspecified trimester: Secondary | ICD-10-CM

## 2018-01-13 DIAGNOSIS — O289 Unspecified abnormal findings on antenatal screening of mother: Secondary | ICD-10-CM | POA: Diagnosis not present

## 2018-01-13 DIAGNOSIS — Z3A19 19 weeks gestation of pregnancy: Secondary | ICD-10-CM | POA: Insufficient documentation

## 2018-01-13 DIAGNOSIS — O34219 Maternal care for unspecified type scar from previous cesarean delivery: Secondary | ICD-10-CM | POA: Diagnosis not present

## 2018-01-13 DIAGNOSIS — O09212 Supervision of pregnancy with history of pre-term labor, second trimester: Secondary | ICD-10-CM | POA: Diagnosis not present

## 2018-01-13 DIAGNOSIS — O288 Other abnormal findings on antenatal screening of mother: Secondary | ICD-10-CM | POA: Diagnosis not present

## 2018-01-13 DIAGNOSIS — Z363 Encounter for antenatal screening for malformations: Secondary | ICD-10-CM

## 2018-01-13 DIAGNOSIS — Z3689 Encounter for other specified antenatal screening: Secondary | ICD-10-CM

## 2018-01-13 DIAGNOSIS — O280 Abnormal hematological finding on antenatal screening of mother: Secondary | ICD-10-CM

## 2018-01-13 HISTORY — DX: Essential (primary) hypertension: I10

## 2018-01-13 HISTORY — DX: Tubulo-interstitial nephritis, not specified as acute or chronic: N12

## 2018-01-13 HISTORY — DX: Type 2 diabetes mellitus without complications: E11.9

## 2018-01-13 HISTORY — DX: Gestational (pregnancy-induced) hypertension without significant proteinuria, unspecified trimester: O13.9

## 2018-01-14 DIAGNOSIS — O280 Abnormal hematological finding on antenatal screening of mother: Secondary | ICD-10-CM

## 2018-01-14 DIAGNOSIS — Z3A19 19 weeks gestation of pregnancy: Secondary | ICD-10-CM | POA: Insufficient documentation

## 2018-01-14 HISTORY — DX: Abnormal hematological finding on antenatal screening of mother: O28.0

## 2018-01-14 NOTE — Progress Notes (Signed)
Genetic Counseling  High-Risk Gestation Note  Appointment Date:  01/13/2018 Referred By: Hassell Done, MD Date of Birth:  01-Jul-1986 Partner:  Alden Benjamin   Pregnancy History: [id number] Estimated Date of Delivery: 06/06/18 Estimated Gestational Age: [redacted]w[redacted]d Attending: Ledon Snare, MD   Emily Weber and her partner, Mr. Azeneth Carbonell, were seen for consultation for genetic counseling because of an elevated MSAFP of 4.06 MoMs based on maternal serum screening through [facility].    In summary:  Discussed elevated MSAFP   Reviewed possible explanations for elevation  Discussed additional options  Ultrasound- performed today, see separate report  Amniocentesis- declined  Discussed associations with unexplained elevated MSAFP  Reviewed family history concerns  We reviewed Emily Weber's maternal serum screening result, the elevation of MSAFP, and the associated 1 in 10 risk for a fetal open neural tube defect.   We reviewed open neural tube defects including: the typical multifactorial etiology and variable prognosis.  In addition, we discussed alternative explanations for an elevated MSAFP including: normal variation, twins, feto-maternal bleeding, a gestational dating error, abdominal wall defects, kidney differences, oligohydramnios, and placental problems.  We discussed that an unexplained elevation of MSAFP is associated with an increased risk for third trimester complications including: prematurity, low birth weight, and pre-eclampsia.    We reviewed additional available screening and diagnostic options including detailed ultrasound and amniocentesis.  We discussed the risks, limitations, and benefits of each.  After thoughtful consideration of these options, Emily Weber elected to have ultrasound only and declined amniocentesis.  She understands that ultrasound cannot rule out all birth defects or genetic syndromes.  However, she was counseled that ~90% of fetuses with open  neural tube defects can be detected by detailed second trimester ultrasound, when well visualized.  A complete ultrasound was performed today. Visualized fetal anatomy, including fetal spine were within normal limits. Bilateral urinary tract dilation was visualized today (5-6 mm). The ultrasound report will be sent under separate cover.  We discussed that fetal urinary tract dilation (UTD), or pyelectasis, is defined as the dilatation of the fetal renal pelvis/pelvises due to excess urine. This finding is estimated to occur in 2-3% of fetuses.  The female to female ratio is 2:1.  Typically, babies with mild pyelectasis are born normal and healthy and we are usually unable to determine why this extra fluid is present.  This urine accumulation may regress, stay the same or continue to accumulate.  The more fluid that accumulates, the more likely this fluid could be the result of a compromise in kidney function, an obstruction, or narrowing of the ureters which transport urine out of the body, thus causing backflow of fluid into the kidneys.  Therefore, it is important to follow pyelectasis to make sure it does not become more concerning.  Also, in some cases postnatal evaluation of baby's kidneys may be warranted.  We discussed that the finding of pyelectasis is associated with an increased risk for fetal aneuploidy.  This risk is highest when other anomalies or fetal differences are visualized. Emily Weber previously had first trimester screening through [facility], which was within normal limits for the conditions screened. We reviewed the screen results and the associated reduction in risks for fetal Down syndrome (1 in 712 to 1 in >10,000) and Trisomy 18 (1394 to 1 in 9800). She understands that screening is not diagnostic and cannot assess for all chromosome conditions. We discussed that the presence of mild UTD would not be expected to significantly adjust the risk for fetal  Down syndrome above the patient's first  trimester screening risk. Follow-up ultrasound was scheduled for 02/11/18.   Emily Weber was provided with written information regarding cystic fibrosis (CF), spinal muscular atrophy (SMA) and hemoglobinopathies including the carrier frequency, availability of carrier screening and prenatal diagnosis if indicated.  In addition, we discussed that CF and hemoglobinopathies are routinely screened for as part of the [place] newborn screening panel.  She declined screening for CF, SMA and hemoglobinopathies.  Both family histories were reviewed and found to be noncontributory for birth defects, intellectual disability, and known genetic conditions.  Without further information regarding the provided family history, an accurate genetic risk cannot be calculated. Further genetic counseling is warranted if more information is obtained.  Emily Weber denied exposure to environmental toxins or chemical agents. She denied the use of alcohol, tobacco or street drugs. She denied significant viral illnesses during the course of her pregnancy. Her medical and surgical histories were noncontributory.   I counseled this couple for approximately 35 minutes regarding the above risks and available options.    Quinn PlowmanKaren Lida Berkery, MS,  Certified Genetic Counselor 01/14/2018

## 2018-01-17 ENCOUNTER — Ambulatory Visit (INDEPENDENT_AMBULATORY_CARE_PROVIDER_SITE_OTHER): Payer: PRIVATE HEALTH INSURANCE | Admitting: Psychology

## 2018-01-17 DIAGNOSIS — F429 Obsessive-compulsive disorder, unspecified: Secondary | ICD-10-CM | POA: Diagnosis not present

## 2018-01-17 DIAGNOSIS — F411 Generalized anxiety disorder: Secondary | ICD-10-CM | POA: Diagnosis not present

## 2018-01-28 ENCOUNTER — Other Ambulatory Visit (HOSPITAL_COMMUNITY): Payer: Self-pay

## 2018-01-28 ENCOUNTER — Encounter (HOSPITAL_COMMUNITY): Payer: Self-pay

## 2018-02-02 DIAGNOSIS — O26892 Other specified pregnancy related conditions, second trimester: Secondary | ICD-10-CM | POA: Diagnosis not present

## 2018-02-02 DIAGNOSIS — Z3A22 22 weeks gestation of pregnancy: Secondary | ICD-10-CM | POA: Diagnosis not present

## 2018-02-02 DIAGNOSIS — R1030 Lower abdominal pain, unspecified: Secondary | ICD-10-CM | POA: Diagnosis not present

## 2018-02-02 DIAGNOSIS — M545 Low back pain: Secondary | ICD-10-CM | POA: Diagnosis not present

## 2018-02-09 ENCOUNTER — Encounter (HOSPITAL_COMMUNITY): Payer: Self-pay

## 2018-02-11 ENCOUNTER — Other Ambulatory Visit (HOSPITAL_COMMUNITY): Payer: Self-pay | Admitting: *Deleted

## 2018-02-11 ENCOUNTER — Encounter (HOSPITAL_COMMUNITY): Payer: Self-pay

## 2018-02-11 ENCOUNTER — Ambulatory Visit (HOSPITAL_COMMUNITY)
Admission: RE | Admit: 2018-02-11 | Discharge: 2018-02-11 | Disposition: A | Discharge status: Home / Self Care | Payer: BLUE CROSS/BLUE SHIELD | Source: Ambulatory Visit | Attending: Obstetrics and Gynecology | Admitting: Obstetrics and Gynecology

## 2018-02-11 DIAGNOSIS — O09292 Supervision of pregnancy with other poor reproductive or obstetric history, second trimester: Secondary | ICD-10-CM | POA: Diagnosis not present

## 2018-02-11 DIAGNOSIS — O10012 Pre-existing essential hypertension complicating pregnancy, second trimester: Secondary | ICD-10-CM | POA: Diagnosis not present

## 2018-02-11 DIAGNOSIS — O09212 Supervision of pregnancy with history of pre-term labor, second trimester: Secondary | ICD-10-CM | POA: Diagnosis not present

## 2018-02-11 DIAGNOSIS — Z363 Encounter for antenatal screening for malformations: Secondary | ICD-10-CM

## 2018-02-11 DIAGNOSIS — O24112 Pre-existing diabetes mellitus, type 2, in pregnancy, second trimester: Secondary | ICD-10-CM | POA: Diagnosis not present

## 2018-02-11 DIAGNOSIS — O34219 Maternal care for unspecified type scar from previous cesarean delivery: Secondary | ICD-10-CM | POA: Insufficient documentation

## 2018-02-11 DIAGNOSIS — O289 Unspecified abnormal findings on antenatal screening of mother: Secondary | ICD-10-CM | POA: Insufficient documentation

## 2018-02-11 DIAGNOSIS — Z3A23 23 weeks gestation of pregnancy: Secondary | ICD-10-CM | POA: Insufficient documentation

## 2018-02-11 DIAGNOSIS — O99212 Obesity complicating pregnancy, second trimester: Secondary | ICD-10-CM | POA: Diagnosis not present

## 2018-02-11 DIAGNOSIS — Z362 Encounter for other antenatal screening follow-up: Secondary | ICD-10-CM | POA: Diagnosis not present

## 2018-02-11 DIAGNOSIS — O10919 Unspecified pre-existing hypertension complicating pregnancy, unspecified trimester: Secondary | ICD-10-CM

## 2018-02-11 HISTORY — DX: Major depressive disorder, single episode, unspecified: F32.9

## 2018-02-11 HISTORY — DX: Depression, unspecified: F32.A

## 2018-02-28 ENCOUNTER — Ambulatory Visit (INDEPENDENT_AMBULATORY_CARE_PROVIDER_SITE_OTHER): Payer: PRIVATE HEALTH INSURANCE | Admitting: Psychology

## 2018-02-28 DIAGNOSIS — F429 Obsessive-compulsive disorder, unspecified: Secondary | ICD-10-CM | POA: Diagnosis not present

## 2018-02-28 DIAGNOSIS — F411 Generalized anxiety disorder: Secondary | ICD-10-CM

## 2018-03-10 DIAGNOSIS — E669 Obesity, unspecified: Secondary | ICD-10-CM | POA: Diagnosis not present

## 2018-03-10 DIAGNOSIS — Z3A25 25 weeks gestation of pregnancy: Secondary | ICD-10-CM | POA: Diagnosis not present

## 2018-03-10 DIAGNOSIS — E1169 Type 2 diabetes mellitus with other specified complication: Secondary | ICD-10-CM | POA: Diagnosis not present

## 2018-03-10 DIAGNOSIS — Z3A27 27 weeks gestation of pregnancy: Secondary | ICD-10-CM | POA: Diagnosis not present

## 2018-03-10 DIAGNOSIS — Z113 Encounter for screening for infections with a predominantly sexual mode of transmission: Secondary | ICD-10-CM | POA: Diagnosis not present

## 2018-03-10 DIAGNOSIS — I10 Essential (primary) hypertension: Secondary | ICD-10-CM | POA: Diagnosis not present

## 2018-03-11 ENCOUNTER — Encounter (HOSPITAL_COMMUNITY): Payer: Self-pay

## 2018-03-11 ENCOUNTER — Ambulatory Visit (HOSPITAL_COMMUNITY): Payer: BLUE CROSS/BLUE SHIELD

## 2018-03-12 DIAGNOSIS — B351 Tinea unguium: Secondary | ICD-10-CM | POA: Diagnosis not present

## 2018-03-12 DIAGNOSIS — L03032 Cellulitis of left toe: Secondary | ICD-10-CM | POA: Diagnosis not present

## 2018-03-18 DIAGNOSIS — O26893 Other specified pregnancy related conditions, third trimester: Secondary | ICD-10-CM | POA: Diagnosis not present

## 2018-03-18 DIAGNOSIS — Z3A28 28 weeks gestation of pregnancy: Secondary | ICD-10-CM | POA: Diagnosis not present

## 2018-03-18 DIAGNOSIS — R109 Unspecified abdominal pain: Secondary | ICD-10-CM | POA: Diagnosis not present

## 2018-03-18 DIAGNOSIS — N132 Hydronephrosis with renal and ureteral calculous obstruction: Secondary | ICD-10-CM | POA: Diagnosis not present

## 2018-03-18 DIAGNOSIS — N200 Calculus of kidney: Secondary | ICD-10-CM | POA: Diagnosis not present

## 2018-03-21 ENCOUNTER — Ambulatory Visit (INDEPENDENT_AMBULATORY_CARE_PROVIDER_SITE_OTHER): Payer: PRIVATE HEALTH INSURANCE | Admitting: Psychology

## 2018-03-21 DIAGNOSIS — F429 Obsessive-compulsive disorder, unspecified: Secondary | ICD-10-CM

## 2018-03-21 DIAGNOSIS — F411 Generalized anxiety disorder: Secondary | ICD-10-CM | POA: Diagnosis not present

## 2018-03-25 DIAGNOSIS — O358XX0 Maternal care for other (suspected) fetal abnormality and damage, not applicable or unspecified: Secondary | ICD-10-CM | POA: Diagnosis not present

## 2018-03-25 DIAGNOSIS — Z3A29 29 weeks gestation of pregnancy: Secondary | ICD-10-CM | POA: Diagnosis not present

## 2018-03-25 DIAGNOSIS — Z3689 Encounter for other specified antenatal screening: Secondary | ICD-10-CM | POA: Diagnosis not present

## 2018-04-08 DIAGNOSIS — Z3A31 31 weeks gestation of pregnancy: Secondary | ICD-10-CM | POA: Diagnosis not present

## 2018-04-08 DIAGNOSIS — O2343 Unspecified infection of urinary tract in pregnancy, third trimester: Secondary | ICD-10-CM | POA: Diagnosis not present

## 2018-04-11 ENCOUNTER — Ambulatory Visit: Payer: BLUE CROSS/BLUE SHIELD | Admitting: Psychology

## 2018-04-18 DIAGNOSIS — E1169 Type 2 diabetes mellitus with other specified complication: Secondary | ICD-10-CM | POA: Diagnosis not present

## 2018-04-18 DIAGNOSIS — E669 Obesity, unspecified: Secondary | ICD-10-CM | POA: Diagnosis not present

## 2018-04-27 DIAGNOSIS — R51 Headache: Secondary | ICD-10-CM | POA: Diagnosis not present

## 2018-04-27 DIAGNOSIS — O26893 Other specified pregnancy related conditions, third trimester: Secondary | ICD-10-CM | POA: Diagnosis not present

## 2018-04-27 DIAGNOSIS — Z3A34 34 weeks gestation of pregnancy: Secondary | ICD-10-CM | POA: Diagnosis not present

## 2018-04-29 DIAGNOSIS — O99213 Obesity complicating pregnancy, third trimester: Secondary | ICD-10-CM | POA: Diagnosis not present

## 2018-04-29 DIAGNOSIS — O280 Abnormal hematological finding on antenatal screening of mother: Secondary | ICD-10-CM | POA: Diagnosis not present

## 2018-04-29 DIAGNOSIS — O24113 Pre-existing diabetes mellitus, type 2, in pregnancy, third trimester: Secondary | ICD-10-CM | POA: Diagnosis not present

## 2018-04-29 DIAGNOSIS — E1169 Type 2 diabetes mellitus with other specified complication: Secondary | ICD-10-CM | POA: Diagnosis not present

## 2018-04-29 DIAGNOSIS — E669 Obesity, unspecified: Secondary | ICD-10-CM | POA: Diagnosis not present

## 2018-05-02 ENCOUNTER — Ambulatory Visit (INDEPENDENT_AMBULATORY_CARE_PROVIDER_SITE_OTHER): Payer: PRIVATE HEALTH INSURANCE | Admitting: Psychology

## 2018-05-02 DIAGNOSIS — F429 Obsessive-compulsive disorder, unspecified: Secondary | ICD-10-CM

## 2018-05-02 DIAGNOSIS — F411 Generalized anxiety disorder: Secondary | ICD-10-CM | POA: Diagnosis not present

## 2018-05-03 ENCOUNTER — Encounter: Payer: Self-pay | Admitting: Podiatry

## 2018-05-03 ENCOUNTER — Ambulatory Visit (INDEPENDENT_AMBULATORY_CARE_PROVIDER_SITE_OTHER): Payer: BLUE CROSS/BLUE SHIELD | Admitting: Podiatry

## 2018-05-03 VITALS — BP 112/68 | HR 89 | Temp 98.9°F | Resp 16 | Ht 63.0 in | Wt 280.0 lb

## 2018-05-03 DIAGNOSIS — L600 Ingrowing nail: Secondary | ICD-10-CM

## 2018-05-03 DIAGNOSIS — M79676 Pain in unspecified toe(s): Secondary | ICD-10-CM

## 2018-05-03 NOTE — Patient Instructions (Addendum)

## 2018-05-03 NOTE — Progress Notes (Signed)
Subjective:  Patient ID: Emily Weber Patient, female    DOB: 1986-01-18,  MRN: [id number]  Chief Complaint  Patient presents with  . Nail Problem    L hallux medial border x 4 wks; very sore/painful Tx: none Pt. stated," I had a pedicure and after it it got infected."     32 y.o. female presents with the above complaint.  Reports pain to the left great toenail medial border.  Present for 4 weeks.  Very sore and painful denies prior treatment.  Had a pedicure and states that she thinks after that it got infected.  Is currently pregnant  Past Medical History:  Diagnosis Date  . Depression   . Diabetes mellitus without complication (HCC)    Type 2  . Hypertension   . Pregnancy induced hypertension   . Pyelonephritis   . UTI (lower urinary tract infection)    Past Surgical History:  Procedure Laterality Date  . CESAREAN SECTION    . CHOLECYSTECTOMY    . TONSILLECTOMY      Current Outpatient Medications:  .  acetaminophen (TYLENOL) 325 MG tablet, Take 325-650 mg by mouth every 6 (six) hours as needed (pain)., Disp: , Rfl:  .  chlorhexidine (PERIDEX) 0.12 % solution, Use as directed 15 mLs in the mouth or throat 2 (two) times daily., Disp: , Rfl:  .  Desvenlafaxine Succinate (PRISTIQ PO), Take by mouth., Disp: , Rfl:  .  insulin NPH Human (HUMULIN N,NOVOLIN N) 100 UNIT/ML injection, Inject into the skin., Disp: , Rfl:  .  insulin regular (NOVOLIN R,HUMULIN R) 100 units/mL injection, Inject into the skin 3 (three) times daily before meals., Disp: , Rfl:  .  labetalol (NORMODYNE) 100 MG tablet, Take 100 mg by mouth 2 (two) times daily., Disp: , Rfl:  .  metFORMIN (GLUCOPHAGE-XR) 500 MG 24 hr tablet, Take 500 mg by mouth daily with breakfast., Disp: , Rfl:  .  Multiple Vitamin (MULTIVITAMIN) capsule, Take 1 capsule by mouth daily., Disp: , Rfl:  .  Omeprazole (PRILOSEC PO), Take by mouth., Disp: , Rfl:  .  oxyCODONE-acetaminophen (PERCOCET/ROXICET) 5-325 MG per tablet, Take 1 tablet by mouth  every 6 (six) hours as needed for severe pain. (Patient not taking: Reported on 01/13/2018), Disp: 15 tablet, Rfl: 0 .  Prenatal Vit-Fe Fumarate-FA (PRENATAL VITAMIN PO), Take by mouth., Disp: , Rfl:  .  ranitidine (ZANTAC) 150 MG tablet, Take 150 mg by mouth daily., Disp: , Rfl:   No Known Allergies Review of Systems: Negative except as noted in the HPI. Denies N/V/F/Ch. Objective:   Vitals:   05/03/18 1409  BP: 112/68  Pulse: 89  Resp: 16  Temp: 98.9 F (37.2 C)   General AA&O x3. Normal mood and affect.  Vascular Dorsalis pedis and posterior tibial pulses  present 2+ bilaterally  Capillary refill normal to all digits. Pedal hair growth normal.  Neurologic Epicritic sensation grossly present.  Dermatologic No open lesions. Interspaces clear of maceration. Bilateral hallux nails ingrowing medial lateral aspects  Orthopedic: MMT 5/5 in dorsiflexion, plantarflexion, inversion, and eversion. Normal joint ROM without pain or crepitus.   Assessment & Plan:  Patient was evaluated and treated and all questions answered.  Ingrown nail left hallux -Avoid formal procedures today due to pregnancy.  Nails aggressively debrided and slant back fashion after anesthetizing with ethyl chloride spray.  Patient verbalized relief post nail debridement -Advised to soak tonight. -Follow-up after she is no longer pregnant to consider permanent procedure.  Return if symptoms worsen or  fail to improve.

## 2018-05-06 DIAGNOSIS — Z113 Encounter for screening for infections with a predominantly sexual mode of transmission: Secondary | ICD-10-CM | POA: Diagnosis not present

## 2018-05-06 DIAGNOSIS — E119 Type 2 diabetes mellitus without complications: Secondary | ICD-10-CM | POA: Diagnosis not present

## 2018-05-06 DIAGNOSIS — Z3A35 35 weeks gestation of pregnancy: Secondary | ICD-10-CM | POA: Diagnosis not present

## 2018-05-06 DIAGNOSIS — O24113 Pre-existing diabetes mellitus, type 2, in pregnancy, third trimester: Secondary | ICD-10-CM | POA: Diagnosis not present

## 2018-05-11 DIAGNOSIS — Z3A00 Weeks of gestation of pregnancy not specified: Secondary | ICD-10-CM | POA: Diagnosis not present

## 2018-05-11 DIAGNOSIS — O4290 Premature rupture of membranes, unspecified as to length of time between rupture and onset of labor, unspecified weeks of gestation: Secondary | ICD-10-CM | POA: Diagnosis not present

## 2018-05-11 DIAGNOSIS — O479 False labor, unspecified: Secondary | ICD-10-CM | POA: Diagnosis not present

## 2018-05-19 DIAGNOSIS — O24113 Pre-existing diabetes mellitus, type 2, in pregnancy, third trimester: Secondary | ICD-10-CM | POA: Diagnosis not present

## 2018-05-19 DIAGNOSIS — E119 Type 2 diabetes mellitus without complications: Secondary | ICD-10-CM | POA: Diagnosis not present

## 2018-05-19 DIAGNOSIS — Z3A37 37 weeks gestation of pregnancy: Secondary | ICD-10-CM | POA: Diagnosis not present

## 2018-05-23 ENCOUNTER — Ambulatory Visit: Payer: PRIVATE HEALTH INSURANCE | Admitting: Psychology

## 2018-05-27 DIAGNOSIS — O34219 Maternal care for unspecified type scar from previous cesarean delivery: Secondary | ICD-10-CM | POA: Diagnosis not present

## 2018-05-27 DIAGNOSIS — O24913 Unspecified diabetes mellitus in pregnancy, third trimester: Secondary | ICD-10-CM | POA: Diagnosis not present

## 2018-05-27 DIAGNOSIS — O24013 Pre-existing diabetes mellitus, type 1, in pregnancy, third trimester: Secondary | ICD-10-CM | POA: Diagnosis not present

## 2018-05-27 DIAGNOSIS — Z79899 Other long term (current) drug therapy: Secondary | ICD-10-CM | POA: Diagnosis not present

## 2018-05-27 DIAGNOSIS — O24113 Pre-existing diabetes mellitus, type 2, in pregnancy, third trimester: Secondary | ICD-10-CM | POA: Diagnosis not present

## 2018-05-27 DIAGNOSIS — R51 Headache: Secondary | ICD-10-CM | POA: Diagnosis not present

## 2018-05-27 DIAGNOSIS — O24313 Unspecified pre-existing diabetes mellitus in pregnancy, third trimester: Secondary | ICD-10-CM | POA: Diagnosis not present

## 2018-05-27 DIAGNOSIS — Z3A38 38 weeks gestation of pregnancy: Secondary | ICD-10-CM | POA: Diagnosis not present

## 2018-05-27 DIAGNOSIS — O26893 Other specified pregnancy related conditions, third trimester: Secondary | ICD-10-CM | POA: Diagnosis not present

## 2018-05-27 DIAGNOSIS — O163 Unspecified maternal hypertension, third trimester: Secondary | ICD-10-CM | POA: Diagnosis not present

## 2018-05-27 DIAGNOSIS — Z87891 Personal history of nicotine dependence: Secondary | ICD-10-CM | POA: Diagnosis not present

## 2018-05-27 DIAGNOSIS — Z794 Long term (current) use of insulin: Secondary | ICD-10-CM | POA: Diagnosis not present

## 2018-05-27 DIAGNOSIS — R030 Elevated blood-pressure reading, without diagnosis of hypertension: Secondary | ICD-10-CM | POA: Diagnosis not present

## 2018-05-27 DIAGNOSIS — E109 Type 1 diabetes mellitus without complications: Secondary | ICD-10-CM | POA: Diagnosis not present

## 2018-05-27 DIAGNOSIS — Z3689 Encounter for other specified antenatal screening: Secondary | ICD-10-CM | POA: Diagnosis not present

## 2018-05-27 DIAGNOSIS — O99820 Streptococcus B carrier state complicating pregnancy: Secondary | ICD-10-CM | POA: Diagnosis not present

## 2018-05-27 DIAGNOSIS — E119 Type 2 diabetes mellitus without complications: Secondary | ICD-10-CM | POA: Diagnosis not present

## 2018-05-30 DIAGNOSIS — O24113 Pre-existing diabetes mellitus, type 2, in pregnancy, third trimester: Secondary | ICD-10-CM | POA: Diagnosis not present

## 2018-05-30 DIAGNOSIS — E119 Type 2 diabetes mellitus without complications: Secondary | ICD-10-CM | POA: Diagnosis not present

## 2018-05-30 DIAGNOSIS — Z3A39 39 weeks gestation of pregnancy: Secondary | ICD-10-CM | POA: Diagnosis not present

## 2018-05-31 DIAGNOSIS — O3663X0 Maternal care for excessive fetal growth, third trimester, not applicable or unspecified: Secondary | ICD-10-CM | POA: Diagnosis not present

## 2018-05-31 DIAGNOSIS — Z3A39 39 weeks gestation of pregnancy: Secondary | ICD-10-CM | POA: Diagnosis not present

## 2018-05-31 DIAGNOSIS — O34211 Maternal care for low transverse scar from previous cesarean delivery: Secondary | ICD-10-CM | POA: Diagnosis not present

## 2018-05-31 DIAGNOSIS — Z79899 Other long term (current) drug therapy: Secondary | ICD-10-CM | POA: Diagnosis not present

## 2018-05-31 DIAGNOSIS — Z794 Long term (current) use of insulin: Secondary | ICD-10-CM | POA: Diagnosis not present

## 2018-05-31 DIAGNOSIS — O1092 Unspecified pre-existing hypertension complicating childbirth: Secondary | ICD-10-CM | POA: Diagnosis not present

## 2018-05-31 DIAGNOSIS — E119 Type 2 diabetes mellitus without complications: Secondary | ICD-10-CM | POA: Diagnosis not present

## 2018-05-31 DIAGNOSIS — O403XX0 Polyhydramnios, third trimester, not applicable or unspecified: Secondary | ICD-10-CM | POA: Diagnosis not present

## 2018-05-31 DIAGNOSIS — O2412 Pre-existing diabetes mellitus, type 2, in childbirth: Secondary | ICD-10-CM | POA: Diagnosis not present

## 2018-05-31 DIAGNOSIS — O10919 Unspecified pre-existing hypertension complicating pregnancy, unspecified trimester: Secondary | ICD-10-CM | POA: Diagnosis not present

## 2018-06-06 DIAGNOSIS — O24013 Pre-existing diabetes mellitus, type 1, in pregnancy, third trimester: Secondary | ICD-10-CM | POA: Diagnosis not present

## 2018-06-06 DIAGNOSIS — Z3A39 39 weeks gestation of pregnancy: Secondary | ICD-10-CM | POA: Diagnosis not present

## 2018-06-08 ENCOUNTER — Ambulatory Visit (INDEPENDENT_AMBULATORY_CARE_PROVIDER_SITE_OTHER): Payer: PRIVATE HEALTH INSURANCE | Admitting: Psychology

## 2018-06-08 DIAGNOSIS — F422 Mixed obsessional thoughts and acts: Secondary | ICD-10-CM | POA: Diagnosis not present

## 2018-06-17 ENCOUNTER — Ambulatory Visit (INDEPENDENT_AMBULATORY_CARE_PROVIDER_SITE_OTHER): Payer: PRIVATE HEALTH INSURANCE | Admitting: Psychology

## 2018-06-17 DIAGNOSIS — F422 Mixed obsessional thoughts and acts: Secondary | ICD-10-CM | POA: Diagnosis not present

## 2018-07-04 ENCOUNTER — Ambulatory Visit (INDEPENDENT_AMBULATORY_CARE_PROVIDER_SITE_OTHER): Payer: PRIVATE HEALTH INSURANCE | Admitting: Psychology

## 2018-07-04 DIAGNOSIS — F411 Generalized anxiety disorder: Secondary | ICD-10-CM

## 2018-07-04 DIAGNOSIS — F429 Obsessive-compulsive disorder, unspecified: Secondary | ICD-10-CM

## 2018-07-05 DIAGNOSIS — Z13220 Encounter for screening for lipoid disorders: Secondary | ICD-10-CM | POA: Diagnosis not present

## 2018-07-05 DIAGNOSIS — Z0000 Encounter for general adult medical examination without abnormal findings: Secondary | ICD-10-CM | POA: Diagnosis not present

## 2018-07-05 DIAGNOSIS — Z23 Encounter for immunization: Secondary | ICD-10-CM | POA: Diagnosis not present

## 2018-07-05 DIAGNOSIS — Z6838 Body mass index (BMI) 38.0-38.9, adult: Secondary | ICD-10-CM | POA: Diagnosis not present

## 2018-07-05 DIAGNOSIS — Z131 Encounter for screening for diabetes mellitus: Secondary | ICD-10-CM | POA: Diagnosis not present

## 2018-07-05 DIAGNOSIS — E785 Hyperlipidemia, unspecified: Secondary | ICD-10-CM | POA: Diagnosis not present

## 2018-07-11 ENCOUNTER — Ambulatory Visit (INDEPENDENT_AMBULATORY_CARE_PROVIDER_SITE_OTHER): Payer: PRIVATE HEALTH INSURANCE | Admitting: Psychology

## 2018-07-11 DIAGNOSIS — F429 Obsessive-compulsive disorder, unspecified: Secondary | ICD-10-CM

## 2018-07-11 DIAGNOSIS — F411 Generalized anxiety disorder: Secondary | ICD-10-CM | POA: Diagnosis not present

## 2018-07-20 DIAGNOSIS — S93601A Unspecified sprain of right foot, initial encounter: Secondary | ICD-10-CM | POA: Diagnosis not present

## 2018-07-25 ENCOUNTER — Ambulatory Visit: Payer: BLUE CROSS/BLUE SHIELD | Admitting: Psychology

## 2018-08-01 ENCOUNTER — Ambulatory Visit (INDEPENDENT_AMBULATORY_CARE_PROVIDER_SITE_OTHER): Payer: PRIVATE HEALTH INSURANCE | Admitting: Psychology

## 2018-08-01 DIAGNOSIS — F411 Generalized anxiety disorder: Secondary | ICD-10-CM | POA: Diagnosis not present

## 2018-08-01 DIAGNOSIS — F429 Obsessive-compulsive disorder, unspecified: Secondary | ICD-10-CM | POA: Diagnosis not present

## 2018-08-05 DIAGNOSIS — I10 Essential (primary) hypertension: Secondary | ICD-10-CM | POA: Diagnosis not present

## 2018-08-05 DIAGNOSIS — E1169 Type 2 diabetes mellitus with other specified complication: Secondary | ICD-10-CM | POA: Diagnosis not present

## 2018-08-05 DIAGNOSIS — E669 Obesity, unspecified: Secondary | ICD-10-CM | POA: Diagnosis not present

## 2018-08-05 DIAGNOSIS — E282 Polycystic ovarian syndrome: Secondary | ICD-10-CM | POA: Diagnosis not present

## 2018-08-15 ENCOUNTER — Encounter: Payer: Self-pay | Admitting: Podiatry

## 2018-08-15 ENCOUNTER — Ambulatory Visit (INDEPENDENT_AMBULATORY_CARE_PROVIDER_SITE_OTHER): Payer: BLUE CROSS/BLUE SHIELD | Admitting: Podiatry

## 2018-08-15 ENCOUNTER — Ambulatory Visit: Payer: BLUE CROSS/BLUE SHIELD | Admitting: Psychology

## 2018-08-15 DIAGNOSIS — L600 Ingrowing nail: Secondary | ICD-10-CM | POA: Diagnosis not present

## 2018-08-15 DIAGNOSIS — M79676 Pain in unspecified toe(s): Secondary | ICD-10-CM | POA: Diagnosis not present

## 2018-08-15 NOTE — Progress Notes (Signed)
Subjective:  Patient ID: Emily Weber, female    DOB: July 05, 1986,  MRN: [id number]  Chief Complaint  Patient presents with  . Ingrown Toenail    Left hallux medial border still ingrown and not growing right has been hurting for about 2 mo    32 y.o. female presents with the above complaint. Has given birth to her child and would like to proceed with nail procedure. Review of Systems: Negative except as noted in the HPI. Denies N/V/F/Ch.  Past Medical History:  Diagnosis Date  . Depression   . Diabetes mellitus without complication (HCC)    Type 2  . Hypertension   . Pregnancy induced hypertension   . Pyelonephritis   . UTI (lower urinary tract infection)     Current Outpatient Medications:  .  acetaminophen (TYLENOL) 325 MG tablet, Take 325-650 mg by mouth every 6 (six) hours as needed (pain)., Disp: , Rfl:  .  chlorhexidine (PERIDEX) 0.12 % solution, Use as directed 15 mLs in the mouth or throat 2 (two) times daily., Disp: , Rfl:  .  Desvenlafaxine Succinate (PRISTIQ PO), Take by mouth., Disp: , Rfl:  .  insulin NPH Human (HUMULIN N,NOVOLIN N) 100 UNIT/ML injection, Inject into the skin., Disp: , Rfl:  .  insulin regular (NOVOLIN R,HUMULIN R) 100 units/mL injection, Inject into the skin 3 (three) times daily before meals., Disp: , Rfl:  .  labetalol (NORMODYNE) 100 MG tablet, Take 100 mg by mouth 2 (two) times daily., Disp: , Rfl:  .  metFORMIN (GLUCOPHAGE-XR) 500 MG 24 hr tablet, Take 500 mg by mouth daily with breakfast., Disp: , Rfl:  .  Multiple Vitamin (MULTIVITAMIN) capsule, Take 1 capsule by mouth daily., Disp: , Rfl:  .  Omeprazole (PRILOSEC PO), Take by mouth., Disp: , Rfl:  .  oxyCODONE-acetaminophen (PERCOCET/ROXICET) 5-325 MG per tablet, Take 1 tablet by mouth every 6 (six) hours as needed for severe pain. (Patient not taking: Reported on 01/13/2018), Disp: 15 tablet, Rfl: 0 .  Prenatal Vit-Fe Fumarate-FA (PRENATAL VITAMIN PO), Take by mouth., Disp: , Rfl:  .   ranitidine (ZANTAC) 150 MG tablet, Take 150 mg by mouth daily., Disp: , Rfl:   Social History   Tobacco Use  Smoking Status Current Every Day Smoker  Smokeless Tobacco Never Used    No Known Allergies Objective:  There were no vitals filed for this visit. There is no height or weight on file to calculate BMI. Constitutional Well developed. Well nourished.  Vascular Dorsalis pedis pulses palpable bilaterally. Posterior tibial pulses palpable bilaterally. Capillary refill normal to all digits.  No cyanosis or clubbing noted. Pedal hair growth normal.  Neurologic Normal speech. Oriented to person, place, and time. Epicritic sensation to light touch grossly present bilaterally.  Dermatologic Painful ingrowing nail at both nail borders of the hallux nail left. No other open wounds. No skin lesions.  Orthopedic: Normal joint ROM without pain or crepitus bilaterally. No visible deformities. No bony tenderness.   Radiographs: None Assessment:   1. Ingrown nail   2. Pain around toenail    Plan:  Patient was evaluated and treated and all questions answered.  Ingrown Nail, left -Patient elects to proceed with minor surgery to remove ingrown toenail removal today. Consent reviewed and signed by patient. -Ingrown nail excised. See procedure note. -Educated on post-procedure care including soaking. Written instructions provided and reviewed. -Patient to follow up in 2 weeks for nail check. -Advised to avoid breastfeeding for a day out of precaution of medications  passing through breast milk.  Procedure: Excision of Ingrown Toenail Location: Left 1st toe medial nail borders. Anesthesia: Lidocaine 1% plain; 1.5 mL and Marcaine 0.5% plain; 1.5 mL, digital block. Skin Prep: Betadine. Dressing: Silvadene; telfa; dry, sterile, compression dressing. Technique: Following skin prep, the toe was exsanguinated and a tourniquet was secured at the base of the toe. The affected nail border was  freed, split with a nail splitter, and excised. Chemical matrixectomy was then performed with phenol and irrigated out with alcohol. The tourniquet was then removed and sterile dressing applied. Disposition: Patient tolerated procedure well. Patient to return in 2 weeks for follow-up.   Pincer Nail L 2nd Toe -Nail debrided 2/2 pain. Would consider nail excision in the future.   Return in about 2 weeks (around 08/29/2018) for nail check.

## 2018-08-15 NOTE — Patient Instructions (Signed)

## 2018-08-16 ENCOUNTER — Ambulatory Visit: Payer: PRIVATE HEALTH INSURANCE | Admitting: Psychology

## 2018-08-19 ENCOUNTER — Ambulatory Visit: Payer: PRIVATE HEALTH INSURANCE | Admitting: Psychology

## 2018-08-29 ENCOUNTER — Ambulatory Visit (INDEPENDENT_AMBULATORY_CARE_PROVIDER_SITE_OTHER): Payer: BLUE CROSS/BLUE SHIELD | Admitting: Podiatry

## 2018-08-29 DIAGNOSIS — L600 Ingrowing nail: Secondary | ICD-10-CM

## 2018-08-29 NOTE — Progress Notes (Signed)
  Subjective:  Patient ID: Emily Weber, female    DOB: 08-May-1986,  MRN: [id number]  Chief Complaint  Patient presents with  . nail check    FU L hallux Pt states," doing pretty good, no pain." -pt denies N/v/F?Ch Tx: epsom and abx cream -FBS: under controll A1C: 4.9 PCP: Street x 2 mo   32 y.o. female returns for the above complaint.   Objective:   General AA&O x3. Normal mood and affect.  Vascular Foot warm and well perfused with good capillary refill.  Neurologic Sensation grossly intact.  Dermatologic Nail avulsion site healing well without drainage or erythema. Nail bed with overlying soft crust. Left intact. No signs of local infection.  Orthopedic: No tenderness to palpation of the toe.   Assessment & Plan:  Patient was evaluated and treated and all questions answered.  S/p Ingrown Toenail Excision, right -Healing well without issue. -Discussed return precautions. -F/u PRN

## 2018-09-05 ENCOUNTER — Ambulatory Visit (INDEPENDENT_AMBULATORY_CARE_PROVIDER_SITE_OTHER): Payer: PRIVATE HEALTH INSURANCE | Admitting: Psychology

## 2018-09-05 DIAGNOSIS — F411 Generalized anxiety disorder: Secondary | ICD-10-CM

## 2018-09-05 DIAGNOSIS — F429 Obsessive-compulsive disorder, unspecified: Secondary | ICD-10-CM | POA: Diagnosis not present

## 2018-09-26 ENCOUNTER — Ambulatory Visit: Payer: PRIVATE HEALTH INSURANCE | Admitting: Psychology

## 2018-09-27 DIAGNOSIS — R109 Unspecified abdominal pain: Secondary | ICD-10-CM | POA: Diagnosis not present

## 2018-09-27 DIAGNOSIS — R05 Cough: Secondary | ICD-10-CM | POA: Diagnosis not present

## 2018-09-27 DIAGNOSIS — J189 Pneumonia, unspecified organism: Secondary | ICD-10-CM | POA: Diagnosis not present

## 2018-09-27 DIAGNOSIS — R509 Fever, unspecified: Secondary | ICD-10-CM | POA: Diagnosis not present

## 2018-09-27 DIAGNOSIS — F17210 Nicotine dependence, cigarettes, uncomplicated: Secondary | ICD-10-CM | POA: Diagnosis not present

## 2018-09-27 DIAGNOSIS — R0602 Shortness of breath: Secondary | ICD-10-CM | POA: Diagnosis not present

## 2018-09-28 DIAGNOSIS — R509 Fever, unspecified: Secondary | ICD-10-CM | POA: Diagnosis not present

## 2018-09-28 DIAGNOSIS — J189 Pneumonia, unspecified organism: Secondary | ICD-10-CM | POA: Diagnosis not present

## 2018-09-29 DIAGNOSIS — R509 Fever, unspecified: Secondary | ICD-10-CM | POA: Diagnosis not present

## 2018-10-17 ENCOUNTER — Ambulatory Visit (INDEPENDENT_AMBULATORY_CARE_PROVIDER_SITE_OTHER): Payer: PRIVATE HEALTH INSURANCE | Admitting: Psychology

## 2018-10-17 DIAGNOSIS — F411 Generalized anxiety disorder: Secondary | ICD-10-CM | POA: Diagnosis not present

## 2018-10-17 DIAGNOSIS — F429 Obsessive-compulsive disorder, unspecified: Secondary | ICD-10-CM | POA: Diagnosis not present

## 2018-11-01 DIAGNOSIS — F410 Panic disorder [episodic paroxysmal anxiety] without agoraphobia: Secondary | ICD-10-CM | POA: Diagnosis not present

## 2018-11-01 DIAGNOSIS — Z6841 Body Mass Index (BMI) 40.0 and over, adult: Secondary | ICD-10-CM | POA: Diagnosis not present

## 2018-11-01 DIAGNOSIS — I10 Essential (primary) hypertension: Secondary | ICD-10-CM | POA: Diagnosis not present

## 2018-11-07 ENCOUNTER — Ambulatory Visit: Payer: PRIVATE HEALTH INSURANCE | Admitting: Psychology

## 2018-12-05 ENCOUNTER — Ambulatory Visit (INDEPENDENT_AMBULATORY_CARE_PROVIDER_SITE_OTHER): Payer: PRIVATE HEALTH INSURANCE | Admitting: Psychology

## 2018-12-05 DIAGNOSIS — F422 Mixed obsessional thoughts and acts: Secondary | ICD-10-CM | POA: Diagnosis not present

## 2018-12-05 DIAGNOSIS — F411 Generalized anxiety disorder: Secondary | ICD-10-CM | POA: Diagnosis not present

## 2018-12-19 ENCOUNTER — Ambulatory Visit (INDEPENDENT_AMBULATORY_CARE_PROVIDER_SITE_OTHER): Payer: PRIVATE HEALTH INSURANCE | Admitting: Psychology

## 2018-12-19 DIAGNOSIS — F429 Obsessive-compulsive disorder, unspecified: Secondary | ICD-10-CM

## 2018-12-19 DIAGNOSIS — F411 Generalized anxiety disorder: Secondary | ICD-10-CM | POA: Diagnosis not present

## 2019-01-09 ENCOUNTER — Ambulatory Visit (INDEPENDENT_AMBULATORY_CARE_PROVIDER_SITE_OTHER): Payer: PRIVATE HEALTH INSURANCE | Admitting: Psychology

## 2019-01-09 DIAGNOSIS — F902 Attention-deficit hyperactivity disorder, combined type: Secondary | ICD-10-CM

## 2019-01-09 DIAGNOSIS — F429 Obsessive-compulsive disorder, unspecified: Secondary | ICD-10-CM

## 2019-01-30 ENCOUNTER — Ambulatory Visit (INDEPENDENT_AMBULATORY_CARE_PROVIDER_SITE_OTHER): Payer: PRIVATE HEALTH INSURANCE | Admitting: Psychology

## 2019-01-30 DIAGNOSIS — F411 Generalized anxiety disorder: Secondary | ICD-10-CM

## 2019-01-30 DIAGNOSIS — F429 Obsessive-compulsive disorder, unspecified: Secondary | ICD-10-CM

## 2019-02-03 DIAGNOSIS — L659 Nonscarring hair loss, unspecified: Secondary | ICD-10-CM | POA: Diagnosis not present

## 2019-02-03 DIAGNOSIS — E669 Obesity, unspecified: Secondary | ICD-10-CM | POA: Diagnosis not present

## 2019-02-03 DIAGNOSIS — Z6839 Body mass index (BMI) 39.0-39.9, adult: Secondary | ICD-10-CM | POA: Diagnosis not present

## 2019-02-03 DIAGNOSIS — I10 Essential (primary) hypertension: Secondary | ICD-10-CM | POA: Diagnosis not present

## 2019-02-03 DIAGNOSIS — E282 Polycystic ovarian syndrome: Secondary | ICD-10-CM | POA: Diagnosis not present

## 2019-02-03 DIAGNOSIS — E1169 Type 2 diabetes mellitus with other specified complication: Secondary | ICD-10-CM | POA: Diagnosis not present

## 2019-02-20 ENCOUNTER — Ambulatory Visit (INDEPENDENT_AMBULATORY_CARE_PROVIDER_SITE_OTHER): Payer: PRIVATE HEALTH INSURANCE | Admitting: Psychology

## 2019-02-20 DIAGNOSIS — F429 Obsessive-compulsive disorder, unspecified: Secondary | ICD-10-CM

## 2019-02-20 DIAGNOSIS — F411 Generalized anxiety disorder: Secondary | ICD-10-CM | POA: Diagnosis not present

## 2019-02-24 DIAGNOSIS — N390 Urinary tract infection, site not specified: Secondary | ICD-10-CM | POA: Diagnosis not present

## 2019-02-24 DIAGNOSIS — F17200 Nicotine dependence, unspecified, uncomplicated: Secondary | ICD-10-CM | POA: Diagnosis not present

## 2019-02-24 DIAGNOSIS — R1031 Right lower quadrant pain: Secondary | ICD-10-CM | POA: Diagnosis not present

## 2019-02-24 DIAGNOSIS — B9689 Other specified bacterial agents as the cause of diseases classified elsewhere: Secondary | ICD-10-CM | POA: Diagnosis not present

## 2019-02-24 DIAGNOSIS — Z79899 Other long term (current) drug therapy: Secondary | ICD-10-CM | POA: Diagnosis not present

## 2019-02-24 DIAGNOSIS — Z6841 Body Mass Index (BMI) 40.0 and over, adult: Secondary | ICD-10-CM | POA: Diagnosis not present

## 2019-02-24 DIAGNOSIS — N132 Hydronephrosis with renal and ureteral calculous obstruction: Secondary | ICD-10-CM | POA: Diagnosis not present

## 2019-02-24 DIAGNOSIS — N200 Calculus of kidney: Secondary | ICD-10-CM | POA: Diagnosis not present

## 2019-02-24 DIAGNOSIS — N1330 Unspecified hydronephrosis: Secondary | ICD-10-CM | POA: Diagnosis not present

## 2019-02-24 DIAGNOSIS — Z9049 Acquired absence of other specified parts of digestive tract: Secondary | ICD-10-CM | POA: Diagnosis not present

## 2019-02-24 DIAGNOSIS — N201 Calculus of ureter: Secondary | ICD-10-CM | POA: Diagnosis not present

## 2019-02-27 DIAGNOSIS — N200 Calculus of kidney: Secondary | ICD-10-CM | POA: Diagnosis not present

## 2019-02-27 DIAGNOSIS — R1031 Right lower quadrant pain: Secondary | ICD-10-CM | POA: Diagnosis not present

## 2019-02-28 DIAGNOSIS — E119 Type 2 diabetes mellitus without complications: Secondary | ICD-10-CM | POA: Diagnosis not present

## 2019-02-28 DIAGNOSIS — N202 Calculus of kidney with calculus of ureter: Secondary | ICD-10-CM | POA: Diagnosis not present

## 2019-02-28 DIAGNOSIS — Z79899 Other long term (current) drug therapy: Secondary | ICD-10-CM | POA: Diagnosis not present

## 2019-02-28 DIAGNOSIS — F17210 Nicotine dependence, cigarettes, uncomplicated: Secondary | ICD-10-CM | POA: Diagnosis not present

## 2019-02-28 DIAGNOSIS — N132 Hydronephrosis with renal and ureteral calculous obstruction: Secondary | ICD-10-CM | POA: Diagnosis not present

## 2019-02-28 DIAGNOSIS — N200 Calculus of kidney: Secondary | ICD-10-CM | POA: Diagnosis not present

## 2019-02-28 DIAGNOSIS — K219 Gastro-esophageal reflux disease without esophagitis: Secondary | ICD-10-CM | POA: Diagnosis not present

## 2019-02-28 DIAGNOSIS — F17200 Nicotine dependence, unspecified, uncomplicated: Secondary | ICD-10-CM | POA: Diagnosis not present

## 2019-03-01 DIAGNOSIS — K219 Gastro-esophageal reflux disease without esophagitis: Secondary | ICD-10-CM | POA: Diagnosis not present

## 2019-03-01 DIAGNOSIS — E119 Type 2 diabetes mellitus without complications: Secondary | ICD-10-CM | POA: Diagnosis not present

## 2019-03-01 DIAGNOSIS — Z466 Encounter for fitting and adjustment of urinary device: Secondary | ICD-10-CM | POA: Diagnosis not present

## 2019-03-01 DIAGNOSIS — Z79899 Other long term (current) drug therapy: Secondary | ICD-10-CM | POA: Diagnosis not present

## 2019-03-01 DIAGNOSIS — F17200 Nicotine dependence, unspecified, uncomplicated: Secondary | ICD-10-CM | POA: Diagnosis not present

## 2019-03-01 DIAGNOSIS — F17210 Nicotine dependence, cigarettes, uncomplicated: Secondary | ICD-10-CM | POA: Diagnosis not present

## 2019-03-01 DIAGNOSIS — N200 Calculus of kidney: Secondary | ICD-10-CM | POA: Diagnosis not present

## 2019-03-02 DIAGNOSIS — N200 Calculus of kidney: Secondary | ICD-10-CM | POA: Diagnosis not present

## 2019-03-02 DIAGNOSIS — R1031 Right lower quadrant pain: Secondary | ICD-10-CM | POA: Diagnosis not present

## 2019-03-08 DIAGNOSIS — N200 Calculus of kidney: Secondary | ICD-10-CM | POA: Diagnosis not present

## 2019-03-08 DIAGNOSIS — R7303 Prediabetes: Secondary | ICD-10-CM | POA: Diagnosis not present

## 2019-03-08 DIAGNOSIS — Z79899 Other long term (current) drug therapy: Secondary | ICD-10-CM | POA: Diagnosis not present

## 2019-03-10 DIAGNOSIS — Z79899 Other long term (current) drug therapy: Secondary | ICD-10-CM | POA: Diagnosis not present

## 2019-03-10 DIAGNOSIS — R7303 Prediabetes: Secondary | ICD-10-CM | POA: Diagnosis not present

## 2019-03-10 DIAGNOSIS — N201 Calculus of ureter: Secondary | ICD-10-CM | POA: Diagnosis not present

## 2019-03-10 DIAGNOSIS — N202 Calculus of kidney with calculus of ureter: Secondary | ICD-10-CM | POA: Diagnosis not present

## 2019-03-10 DIAGNOSIS — Z9049 Acquired absence of other specified parts of digestive tract: Secondary | ICD-10-CM | POA: Diagnosis not present

## 2019-03-10 DIAGNOSIS — N200 Calculus of kidney: Secondary | ICD-10-CM | POA: Diagnosis not present

## 2019-03-13 ENCOUNTER — Ambulatory Visit: Payer: Self-pay | Admitting: Psychology

## 2019-03-14 DIAGNOSIS — N200 Calculus of kidney: Secondary | ICD-10-CM | POA: Diagnosis not present

## 2019-03-14 DIAGNOSIS — N201 Calculus of ureter: Secondary | ICD-10-CM | POA: Diagnosis not present

## 2019-03-15 DIAGNOSIS — N200 Calculus of kidney: Secondary | ICD-10-CM | POA: Diagnosis not present

## 2019-03-15 DIAGNOSIS — R1031 Right lower quadrant pain: Secondary | ICD-10-CM | POA: Diagnosis not present

## 2019-03-21 DIAGNOSIS — N200 Calculus of kidney: Secondary | ICD-10-CM | POA: Diagnosis not present

## 2019-03-21 DIAGNOSIS — R1031 Right lower quadrant pain: Secondary | ICD-10-CM | POA: Diagnosis not present

## 2019-03-21 DIAGNOSIS — N202 Calculus of kidney with calculus of ureter: Secondary | ICD-10-CM | POA: Diagnosis not present

## 2019-03-22 DIAGNOSIS — N209 Urinary calculus, unspecified: Secondary | ICD-10-CM | POA: Diagnosis not present

## 2019-03-22 DIAGNOSIS — R1031 Right lower quadrant pain: Secondary | ICD-10-CM | POA: Diagnosis not present

## 2019-03-22 DIAGNOSIS — N200 Calculus of kidney: Secondary | ICD-10-CM | POA: Diagnosis not present

## 2019-03-29 DIAGNOSIS — N200 Calculus of kidney: Secondary | ICD-10-CM | POA: Diagnosis not present

## 2019-03-29 DIAGNOSIS — R1031 Right lower quadrant pain: Secondary | ICD-10-CM | POA: Diagnosis not present

## 2019-04-03 ENCOUNTER — Ambulatory Visit: Payer: PRIVATE HEALTH INSURANCE | Admitting: Psychology

## 2019-04-03 DIAGNOSIS — E669 Obesity, unspecified: Secondary | ICD-10-CM | POA: Diagnosis not present

## 2019-04-03 DIAGNOSIS — N200 Calculus of kidney: Secondary | ICD-10-CM | POA: Diagnosis not present

## 2019-04-07 DIAGNOSIS — E669 Obesity, unspecified: Secondary | ICD-10-CM | POA: Diagnosis not present

## 2019-04-07 DIAGNOSIS — N200 Calculus of kidney: Secondary | ICD-10-CM | POA: Diagnosis not present

## 2019-04-12 DIAGNOSIS — N200 Calculus of kidney: Secondary | ICD-10-CM | POA: Diagnosis not present

## 2019-04-12 DIAGNOSIS — R1031 Right lower quadrant pain: Secondary | ICD-10-CM | POA: Diagnosis not present

## 2019-04-24 ENCOUNTER — Ambulatory Visit (INDEPENDENT_AMBULATORY_CARE_PROVIDER_SITE_OTHER): Payer: PRIVATE HEALTH INSURANCE | Admitting: Psychology

## 2019-04-24 DIAGNOSIS — F411 Generalized anxiety disorder: Secondary | ICD-10-CM | POA: Diagnosis not present

## 2019-04-24 DIAGNOSIS — F429 Obsessive-compulsive disorder, unspecified: Secondary | ICD-10-CM | POA: Diagnosis not present

## 2019-05-02 DIAGNOSIS — I10 Essential (primary) hypertension: Secondary | ICD-10-CM | POA: Diagnosis not present

## 2019-05-02 DIAGNOSIS — K219 Gastro-esophageal reflux disease without esophagitis: Secondary | ICD-10-CM | POA: Diagnosis not present

## 2019-05-02 DIAGNOSIS — F410 Panic disorder [episodic paroxysmal anxiety] without agoraphobia: Secondary | ICD-10-CM | POA: Diagnosis not present

## 2019-05-03 DIAGNOSIS — N201 Calculus of ureter: Secondary | ICD-10-CM | POA: Diagnosis not present

## 2019-05-03 DIAGNOSIS — N200 Calculus of kidney: Secondary | ICD-10-CM | POA: Diagnosis not present

## 2019-05-03 DIAGNOSIS — N132 Hydronephrosis with renal and ureteral calculous obstruction: Secondary | ICD-10-CM | POA: Diagnosis not present

## 2019-05-10 DIAGNOSIS — N132 Hydronephrosis with renal and ureteral calculous obstruction: Secondary | ICD-10-CM | POA: Diagnosis not present

## 2019-05-10 DIAGNOSIS — N200 Calculus of kidney: Secondary | ICD-10-CM | POA: Diagnosis not present

## 2019-05-10 DIAGNOSIS — N201 Calculus of ureter: Secondary | ICD-10-CM | POA: Diagnosis not present

## 2019-05-10 DIAGNOSIS — R1031 Right lower quadrant pain: Secondary | ICD-10-CM | POA: Diagnosis not present

## 2019-05-15 ENCOUNTER — Ambulatory Visit (INDEPENDENT_AMBULATORY_CARE_PROVIDER_SITE_OTHER): Payer: PRIVATE HEALTH INSURANCE | Admitting: Psychology

## 2019-05-15 DIAGNOSIS — F429 Obsessive-compulsive disorder, unspecified: Secondary | ICD-10-CM | POA: Diagnosis not present

## 2019-05-15 DIAGNOSIS — F411 Generalized anxiety disorder: Secondary | ICD-10-CM

## 2019-05-31 DIAGNOSIS — N200 Calculus of kidney: Secondary | ICD-10-CM | POA: Diagnosis not present

## 2019-05-31 DIAGNOSIS — R1031 Right lower quadrant pain: Secondary | ICD-10-CM | POA: Diagnosis not present

## 2019-06-05 ENCOUNTER — Ambulatory Visit (INDEPENDENT_AMBULATORY_CARE_PROVIDER_SITE_OTHER): Payer: PRIVATE HEALTH INSURANCE | Admitting: Psychology

## 2019-06-05 DIAGNOSIS — F411 Generalized anxiety disorder: Secondary | ICD-10-CM

## 2019-06-05 DIAGNOSIS — F422 Mixed obsessional thoughts and acts: Secondary | ICD-10-CM

## 2019-06-15 DIAGNOSIS — E282 Polycystic ovarian syndrome: Secondary | ICD-10-CM | POA: Diagnosis not present

## 2019-06-15 DIAGNOSIS — F17200 Nicotine dependence, unspecified, uncomplicated: Secondary | ICD-10-CM | POA: Diagnosis not present

## 2019-06-15 DIAGNOSIS — Z833 Family history of diabetes mellitus: Secondary | ICD-10-CM | POA: Diagnosis not present

## 2019-06-15 DIAGNOSIS — I10 Essential (primary) hypertension: Secondary | ICD-10-CM | POA: Diagnosis not present

## 2019-06-15 DIAGNOSIS — E1169 Type 2 diabetes mellitus with other specified complication: Secondary | ICD-10-CM | POA: Diagnosis not present

## 2019-06-15 DIAGNOSIS — Z79899 Other long term (current) drug therapy: Secondary | ICD-10-CM | POA: Diagnosis not present

## 2019-06-15 DIAGNOSIS — E669 Obesity, unspecified: Secondary | ICD-10-CM | POA: Diagnosis not present

## 2019-06-23 DIAGNOSIS — N201 Calculus of ureter: Secondary | ICD-10-CM | POA: Diagnosis not present

## 2019-06-23 DIAGNOSIS — F419 Anxiety disorder, unspecified: Secondary | ICD-10-CM | POA: Diagnosis not present

## 2019-06-23 DIAGNOSIS — Z79899 Other long term (current) drug therapy: Secondary | ICD-10-CM | POA: Diagnosis not present

## 2019-06-23 DIAGNOSIS — Z23 Encounter for immunization: Secondary | ICD-10-CM | POA: Diagnosis not present

## 2019-06-23 DIAGNOSIS — R1031 Right lower quadrant pain: Secondary | ICD-10-CM | POA: Diagnosis not present

## 2019-06-23 DIAGNOSIS — E119 Type 2 diabetes mellitus without complications: Secondary | ICD-10-CM | POA: Diagnosis not present

## 2019-06-23 DIAGNOSIS — F329 Major depressive disorder, single episode, unspecified: Secondary | ICD-10-CM | POA: Diagnosis not present

## 2019-06-23 DIAGNOSIS — R319 Hematuria, unspecified: Secondary | ICD-10-CM | POA: Diagnosis not present

## 2019-06-23 DIAGNOSIS — Z03818 Encounter for observation for suspected exposure to other biological agents ruled out: Secondary | ICD-10-CM | POA: Diagnosis not present

## 2019-06-23 DIAGNOSIS — N132 Hydronephrosis with renal and ureteral calculous obstruction: Secondary | ICD-10-CM | POA: Diagnosis not present

## 2019-06-23 DIAGNOSIS — F17210 Nicotine dependence, cigarettes, uncomplicated: Secondary | ICD-10-CM | POA: Diagnosis not present

## 2019-06-23 DIAGNOSIS — N390 Urinary tract infection, site not specified: Secondary | ICD-10-CM | POA: Diagnosis not present

## 2019-06-24 DIAGNOSIS — N209 Urinary calculus, unspecified: Secondary | ICD-10-CM | POA: Diagnosis not present

## 2019-06-24 DIAGNOSIS — N201 Calculus of ureter: Secondary | ICD-10-CM | POA: Diagnosis not present

## 2019-06-26 ENCOUNTER — Ambulatory Visit (INDEPENDENT_AMBULATORY_CARE_PROVIDER_SITE_OTHER): Payer: PRIVATE HEALTH INSURANCE | Admitting: Psychology

## 2019-06-26 DIAGNOSIS — F422 Mixed obsessional thoughts and acts: Secondary | ICD-10-CM

## 2019-06-26 DIAGNOSIS — F411 Generalized anxiety disorder: Secondary | ICD-10-CM

## 2019-07-03 DIAGNOSIS — R1031 Right lower quadrant pain: Secondary | ICD-10-CM | POA: Diagnosis not present

## 2019-07-03 DIAGNOSIS — N200 Calculus of kidney: Secondary | ICD-10-CM | POA: Diagnosis not present

## 2019-07-12 DIAGNOSIS — N200 Calculus of kidney: Secondary | ICD-10-CM | POA: Diagnosis not present

## 2019-07-12 DIAGNOSIS — R1031 Right lower quadrant pain: Secondary | ICD-10-CM | POA: Diagnosis not present

## 2019-07-17 ENCOUNTER — Ambulatory Visit: Payer: PRIVATE HEALTH INSURANCE | Admitting: Psychology

## 2019-08-17 DIAGNOSIS — R1031 Right lower quadrant pain: Secondary | ICD-10-CM | POA: Diagnosis not present

## 2019-08-17 DIAGNOSIS — N200 Calculus of kidney: Secondary | ICD-10-CM | POA: Diagnosis not present

## 2019-08-21 DIAGNOSIS — M9905 Segmental and somatic dysfunction of pelvic region: Secondary | ICD-10-CM | POA: Diagnosis not present

## 2019-08-21 DIAGNOSIS — M8538 Osteitis condensans, other site: Secondary | ICD-10-CM | POA: Diagnosis not present

## 2019-08-21 DIAGNOSIS — M461 Sacroiliitis, not elsewhere classified: Secondary | ICD-10-CM | POA: Diagnosis not present

## 2019-08-21 DIAGNOSIS — M9903 Segmental and somatic dysfunction of lumbar region: Secondary | ICD-10-CM | POA: Diagnosis not present

## 2019-08-24 DIAGNOSIS — M8538 Osteitis condensans, other site: Secondary | ICD-10-CM | POA: Diagnosis not present

## 2019-08-24 DIAGNOSIS — M9905 Segmental and somatic dysfunction of pelvic region: Secondary | ICD-10-CM | POA: Diagnosis not present

## 2019-08-24 DIAGNOSIS — M461 Sacroiliitis, not elsewhere classified: Secondary | ICD-10-CM | POA: Diagnosis not present

## 2019-08-24 DIAGNOSIS — M9903 Segmental and somatic dysfunction of lumbar region: Secondary | ICD-10-CM | POA: Diagnosis not present

## 2019-08-28 DIAGNOSIS — M9905 Segmental and somatic dysfunction of pelvic region: Secondary | ICD-10-CM | POA: Diagnosis not present

## 2019-08-28 DIAGNOSIS — M9903 Segmental and somatic dysfunction of lumbar region: Secondary | ICD-10-CM | POA: Diagnosis not present

## 2019-08-28 DIAGNOSIS — M461 Sacroiliitis, not elsewhere classified: Secondary | ICD-10-CM | POA: Diagnosis not present

## 2019-08-28 DIAGNOSIS — M8538 Osteitis condensans, other site: Secondary | ICD-10-CM | POA: Diagnosis not present

## 2019-08-29 DIAGNOSIS — M9903 Segmental and somatic dysfunction of lumbar region: Secondary | ICD-10-CM | POA: Diagnosis not present

## 2019-08-29 DIAGNOSIS — M8538 Osteitis condensans, other site: Secondary | ICD-10-CM | POA: Diagnosis not present

## 2019-08-29 DIAGNOSIS — M9905 Segmental and somatic dysfunction of pelvic region: Secondary | ICD-10-CM | POA: Diagnosis not present

## 2019-08-29 DIAGNOSIS — M461 Sacroiliitis, not elsewhere classified: Secondary | ICD-10-CM | POA: Diagnosis not present

## 2019-08-31 DIAGNOSIS — M9903 Segmental and somatic dysfunction of lumbar region: Secondary | ICD-10-CM | POA: Diagnosis not present

## 2019-08-31 DIAGNOSIS — M461 Sacroiliitis, not elsewhere classified: Secondary | ICD-10-CM | POA: Diagnosis not present

## 2019-08-31 DIAGNOSIS — M8538 Osteitis condensans, other site: Secondary | ICD-10-CM | POA: Diagnosis not present

## 2019-08-31 DIAGNOSIS — M9905 Segmental and somatic dysfunction of pelvic region: Secondary | ICD-10-CM | POA: Diagnosis not present

## 2019-09-04 DIAGNOSIS — M8538 Osteitis condensans, other site: Secondary | ICD-10-CM | POA: Diagnosis not present

## 2019-09-04 DIAGNOSIS — M461 Sacroiliitis, not elsewhere classified: Secondary | ICD-10-CM | POA: Diagnosis not present

## 2019-09-04 DIAGNOSIS — M9905 Segmental and somatic dysfunction of pelvic region: Secondary | ICD-10-CM | POA: Diagnosis not present

## 2019-09-04 DIAGNOSIS — M9903 Segmental and somatic dysfunction of lumbar region: Secondary | ICD-10-CM | POA: Diagnosis not present

## 2019-09-05 DIAGNOSIS — M461 Sacroiliitis, not elsewhere classified: Secondary | ICD-10-CM | POA: Diagnosis not present

## 2019-09-05 DIAGNOSIS — M8538 Osteitis condensans, other site: Secondary | ICD-10-CM | POA: Diagnosis not present

## 2019-09-05 DIAGNOSIS — M9905 Segmental and somatic dysfunction of pelvic region: Secondary | ICD-10-CM | POA: Diagnosis not present

## 2019-09-05 DIAGNOSIS — M9903 Segmental and somatic dysfunction of lumbar region: Secondary | ICD-10-CM | POA: Diagnosis not present

## 2019-09-06 DIAGNOSIS — M9903 Segmental and somatic dysfunction of lumbar region: Secondary | ICD-10-CM | POA: Diagnosis not present

## 2019-09-06 DIAGNOSIS — M8538 Osteitis condensans, other site: Secondary | ICD-10-CM | POA: Diagnosis not present

## 2019-09-06 DIAGNOSIS — M461 Sacroiliitis, not elsewhere classified: Secondary | ICD-10-CM | POA: Diagnosis not present

## 2019-09-06 DIAGNOSIS — M9905 Segmental and somatic dysfunction of pelvic region: Secondary | ICD-10-CM | POA: Diagnosis not present

## 2019-09-11 DIAGNOSIS — N200 Calculus of kidney: Secondary | ICD-10-CM | POA: Diagnosis not present

## 2019-09-11 DIAGNOSIS — M9905 Segmental and somatic dysfunction of pelvic region: Secondary | ICD-10-CM | POA: Diagnosis not present

## 2019-09-11 DIAGNOSIS — M461 Sacroiliitis, not elsewhere classified: Secondary | ICD-10-CM | POA: Diagnosis not present

## 2019-09-11 DIAGNOSIS — M8538 Osteitis condensans, other site: Secondary | ICD-10-CM | POA: Diagnosis not present

## 2019-09-11 DIAGNOSIS — M9903 Segmental and somatic dysfunction of lumbar region: Secondary | ICD-10-CM | POA: Diagnosis not present

## 2019-09-12 DIAGNOSIS — M461 Sacroiliitis, not elsewhere classified: Secondary | ICD-10-CM | POA: Diagnosis not present

## 2019-09-12 DIAGNOSIS — M8538 Osteitis condensans, other site: Secondary | ICD-10-CM | POA: Diagnosis not present

## 2019-09-12 DIAGNOSIS — M9905 Segmental and somatic dysfunction of pelvic region: Secondary | ICD-10-CM | POA: Diagnosis not present

## 2019-09-12 DIAGNOSIS — M9903 Segmental and somatic dysfunction of lumbar region: Secondary | ICD-10-CM | POA: Diagnosis not present

## 2019-09-14 DIAGNOSIS — M9905 Segmental and somatic dysfunction of pelvic region: Secondary | ICD-10-CM | POA: Diagnosis not present

## 2019-09-14 DIAGNOSIS — M9903 Segmental and somatic dysfunction of lumbar region: Secondary | ICD-10-CM | POA: Diagnosis not present

## 2019-09-14 DIAGNOSIS — M8538 Osteitis condensans, other site: Secondary | ICD-10-CM | POA: Diagnosis not present

## 2019-09-14 DIAGNOSIS — M461 Sacroiliitis, not elsewhere classified: Secondary | ICD-10-CM | POA: Diagnosis not present

## 2019-09-18 DIAGNOSIS — M9903 Segmental and somatic dysfunction of lumbar region: Secondary | ICD-10-CM | POA: Diagnosis not present

## 2019-09-18 DIAGNOSIS — M461 Sacroiliitis, not elsewhere classified: Secondary | ICD-10-CM | POA: Diagnosis not present

## 2019-09-18 DIAGNOSIS — M9905 Segmental and somatic dysfunction of pelvic region: Secondary | ICD-10-CM | POA: Diagnosis not present

## 2019-09-18 DIAGNOSIS — M8538 Osteitis condensans, other site: Secondary | ICD-10-CM | POA: Diagnosis not present

## 2019-09-21 DIAGNOSIS — M8538 Osteitis condensans, other site: Secondary | ICD-10-CM | POA: Diagnosis not present

## 2019-09-21 DIAGNOSIS — M9905 Segmental and somatic dysfunction of pelvic region: Secondary | ICD-10-CM | POA: Diagnosis not present

## 2019-09-21 DIAGNOSIS — M461 Sacroiliitis, not elsewhere classified: Secondary | ICD-10-CM | POA: Diagnosis not present

## 2019-09-21 DIAGNOSIS — M9903 Segmental and somatic dysfunction of lumbar region: Secondary | ICD-10-CM | POA: Diagnosis not present

## 2019-09-28 DIAGNOSIS — M461 Sacroiliitis, not elsewhere classified: Secondary | ICD-10-CM | POA: Diagnosis not present

## 2019-09-28 DIAGNOSIS — M8538 Osteitis condensans, other site: Secondary | ICD-10-CM | POA: Diagnosis not present

## 2019-09-28 DIAGNOSIS — M9903 Segmental and somatic dysfunction of lumbar region: Secondary | ICD-10-CM | POA: Diagnosis not present

## 2019-09-28 DIAGNOSIS — M9905 Segmental and somatic dysfunction of pelvic region: Secondary | ICD-10-CM | POA: Diagnosis not present

## 2019-10-03 DIAGNOSIS — M9903 Segmental and somatic dysfunction of lumbar region: Secondary | ICD-10-CM | POA: Diagnosis not present

## 2019-10-03 DIAGNOSIS — M461 Sacroiliitis, not elsewhere classified: Secondary | ICD-10-CM | POA: Diagnosis not present

## 2019-10-03 DIAGNOSIS — M9905 Segmental and somatic dysfunction of pelvic region: Secondary | ICD-10-CM | POA: Diagnosis not present

## 2019-10-03 DIAGNOSIS — M8538 Osteitis condensans, other site: Secondary | ICD-10-CM | POA: Diagnosis not present

## 2019-10-04 DIAGNOSIS — R1031 Right lower quadrant pain: Secondary | ICD-10-CM | POA: Diagnosis not present

## 2019-10-04 DIAGNOSIS — N200 Calculus of kidney: Secondary | ICD-10-CM | POA: Diagnosis not present

## 2019-10-10 DIAGNOSIS — R072 Precordial pain: Secondary | ICD-10-CM | POA: Diagnosis not present

## 2019-10-10 DIAGNOSIS — F410 Panic disorder [episodic paroxysmal anxiety] without agoraphobia: Secondary | ICD-10-CM | POA: Diagnosis not present

## 2019-10-10 DIAGNOSIS — M940 Chondrocostal junction syndrome [Tietze]: Secondary | ICD-10-CM | POA: Diagnosis not present

## 2019-10-10 DIAGNOSIS — I10 Essential (primary) hypertension: Secondary | ICD-10-CM | POA: Diagnosis not present

## 2019-10-12 DIAGNOSIS — M8538 Osteitis condensans, other site: Secondary | ICD-10-CM | POA: Diagnosis not present

## 2019-10-12 DIAGNOSIS — M9903 Segmental and somatic dysfunction of lumbar region: Secondary | ICD-10-CM | POA: Diagnosis not present

## 2019-10-12 DIAGNOSIS — M9905 Segmental and somatic dysfunction of pelvic region: Secondary | ICD-10-CM | POA: Diagnosis not present

## 2019-10-12 DIAGNOSIS — M461 Sacroiliitis, not elsewhere classified: Secondary | ICD-10-CM | POA: Diagnosis not present

## 2019-10-26 DIAGNOSIS — M8538 Osteitis condensans, other site: Secondary | ICD-10-CM | POA: Diagnosis not present

## 2019-10-26 DIAGNOSIS — M9903 Segmental and somatic dysfunction of lumbar region: Secondary | ICD-10-CM | POA: Diagnosis not present

## 2019-10-26 DIAGNOSIS — M461 Sacroiliitis, not elsewhere classified: Secondary | ICD-10-CM | POA: Diagnosis not present

## 2019-10-26 DIAGNOSIS — M9905 Segmental and somatic dysfunction of pelvic region: Secondary | ICD-10-CM | POA: Diagnosis not present

## 2019-11-01 DIAGNOSIS — R222 Localized swelling, mass and lump, trunk: Secondary | ICD-10-CM | POA: Diagnosis not present

## 2019-11-01 DIAGNOSIS — Z6841 Body Mass Index (BMI) 40.0 and over, adult: Secondary | ICD-10-CM | POA: Diagnosis not present

## 2019-11-09 DIAGNOSIS — M9905 Segmental and somatic dysfunction of pelvic region: Secondary | ICD-10-CM | POA: Diagnosis not present

## 2019-11-09 DIAGNOSIS — M461 Sacroiliitis, not elsewhere classified: Secondary | ICD-10-CM | POA: Diagnosis not present

## 2019-11-09 DIAGNOSIS — M9903 Segmental and somatic dysfunction of lumbar region: Secondary | ICD-10-CM | POA: Diagnosis not present

## 2019-11-09 DIAGNOSIS — M8538 Osteitis condensans, other site: Secondary | ICD-10-CM | POA: Diagnosis not present

## 2019-11-18 DIAGNOSIS — Z20828 Contact with and (suspected) exposure to other viral communicable diseases: Secondary | ICD-10-CM | POA: Diagnosis not present

## 2019-11-23 DIAGNOSIS — M8538 Osteitis condensans, other site: Secondary | ICD-10-CM | POA: Diagnosis not present

## 2019-11-23 DIAGNOSIS — M9903 Segmental and somatic dysfunction of lumbar region: Secondary | ICD-10-CM | POA: Diagnosis not present

## 2019-11-23 DIAGNOSIS — M9905 Segmental and somatic dysfunction of pelvic region: Secondary | ICD-10-CM | POA: Diagnosis not present

## 2019-11-23 DIAGNOSIS — M461 Sacroiliitis, not elsewhere classified: Secondary | ICD-10-CM | POA: Diagnosis not present

## 2019-11-26 DIAGNOSIS — J019 Acute sinusitis, unspecified: Secondary | ICD-10-CM | POA: Diagnosis not present

## 2019-11-26 DIAGNOSIS — Z20822 Contact with and (suspected) exposure to covid-19: Secondary | ICD-10-CM | POA: Diagnosis not present

## 2019-12-21 DIAGNOSIS — M461 Sacroiliitis, not elsewhere classified: Secondary | ICD-10-CM | POA: Diagnosis not present

## 2019-12-21 DIAGNOSIS — M9905 Segmental and somatic dysfunction of pelvic region: Secondary | ICD-10-CM | POA: Diagnosis not present

## 2019-12-21 DIAGNOSIS — M9903 Segmental and somatic dysfunction of lumbar region: Secondary | ICD-10-CM | POA: Diagnosis not present

## 2019-12-21 DIAGNOSIS — M8538 Osteitis condensans, other site: Secondary | ICD-10-CM | POA: Diagnosis not present

## 2020-01-05 DIAGNOSIS — Z6841 Body Mass Index (BMI) 40.0 and over, adult: Secondary | ICD-10-CM | POA: Diagnosis not present

## 2020-01-05 DIAGNOSIS — R1905 Periumbilic swelling, mass or lump: Secondary | ICD-10-CM | POA: Diagnosis not present

## 2020-01-05 DIAGNOSIS — Z1331 Encounter for screening for depression: Secondary | ICD-10-CM | POA: Diagnosis not present

## 2020-01-11 HISTORY — PX: VENTRAL HERNIA REPAIR: SHX424

## 2020-01-16 DIAGNOSIS — K429 Umbilical hernia without obstruction or gangrene: Secondary | ICD-10-CM | POA: Diagnosis not present

## 2020-01-16 DIAGNOSIS — K439 Ventral hernia without obstruction or gangrene: Secondary | ICD-10-CM | POA: Diagnosis not present

## 2020-01-16 DIAGNOSIS — R1905 Periumbilic swelling, mass or lump: Secondary | ICD-10-CM | POA: Diagnosis not present

## 2020-01-19 DIAGNOSIS — K432 Incisional hernia without obstruction or gangrene: Secondary | ICD-10-CM | POA: Diagnosis not present

## 2020-01-31 DIAGNOSIS — Z1159 Encounter for screening for other viral diseases: Secondary | ICD-10-CM | POA: Diagnosis not present

## 2020-02-01 DIAGNOSIS — Z79891 Long term (current) use of opiate analgesic: Secondary | ICD-10-CM | POA: Diagnosis not present

## 2020-02-01 DIAGNOSIS — Z6841 Body Mass Index (BMI) 40.0 and over, adult: Secondary | ICD-10-CM | POA: Diagnosis not present

## 2020-02-01 DIAGNOSIS — I10 Essential (primary) hypertension: Secondary | ICD-10-CM | POA: Diagnosis not present

## 2020-02-01 DIAGNOSIS — Z79899 Other long term (current) drug therapy: Secondary | ICD-10-CM | POA: Diagnosis not present

## 2020-02-01 DIAGNOSIS — K219 Gastro-esophageal reflux disease without esophagitis: Secondary | ICD-10-CM | POA: Diagnosis not present

## 2020-02-01 DIAGNOSIS — Z87891 Personal history of nicotine dependence: Secondary | ICD-10-CM | POA: Diagnosis not present

## 2020-02-01 DIAGNOSIS — K432 Incisional hernia without obstruction or gangrene: Secondary | ICD-10-CM | POA: Diagnosis not present

## 2020-02-01 DIAGNOSIS — E119 Type 2 diabetes mellitus without complications: Secondary | ICD-10-CM | POA: Diagnosis not present

## 2020-02-01 DIAGNOSIS — K660 Peritoneal adhesions (postprocedural) (postinfection): Secondary | ICD-10-CM | POA: Diagnosis not present

## 2020-02-08 DIAGNOSIS — Z09 Encounter for follow-up examination after completed treatment for conditions other than malignant neoplasm: Secondary | ICD-10-CM | POA: Diagnosis not present

## 2020-02-24 IMAGING — US US MFM OB FOLLOW-UP
1 series · 14 of 28 positions shown · non-contrast
Comparison: none

[Series 1: us mfm ob follow-up · 14 of 55 slices shown]
[im 3/55]
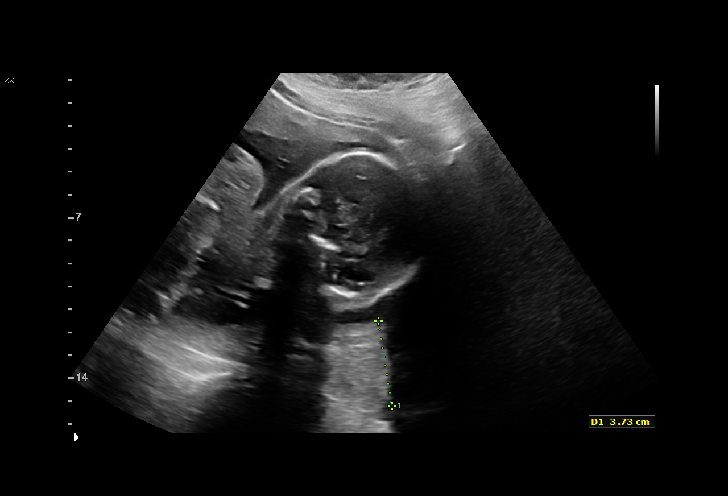
[im 7/55]
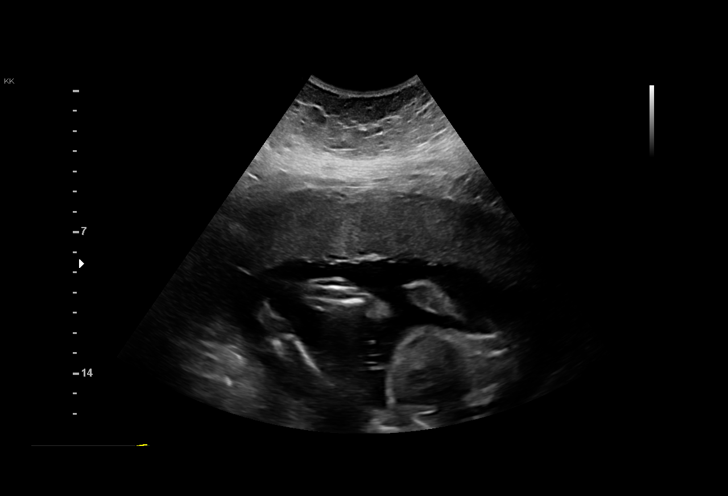
[im 11/55]
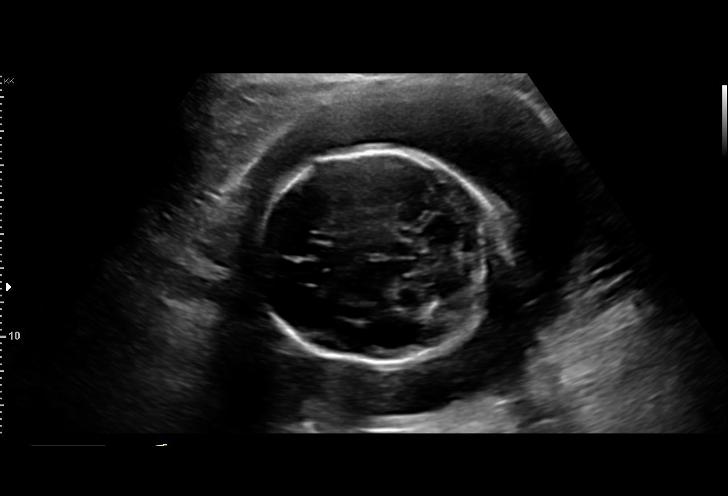
[im 15/55]
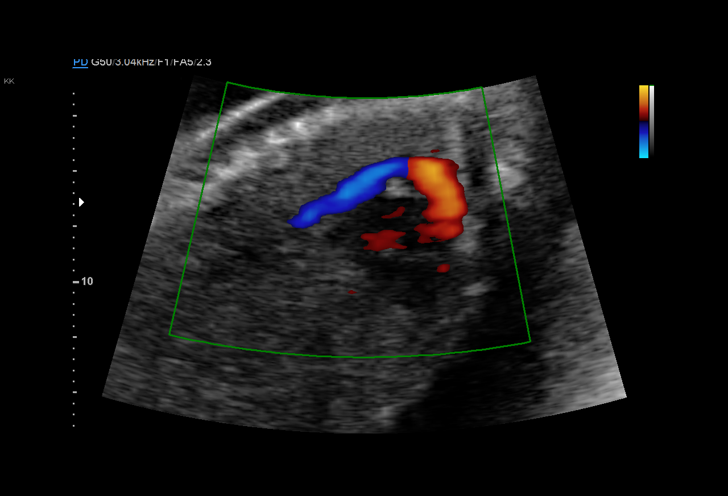
[im 19/55]
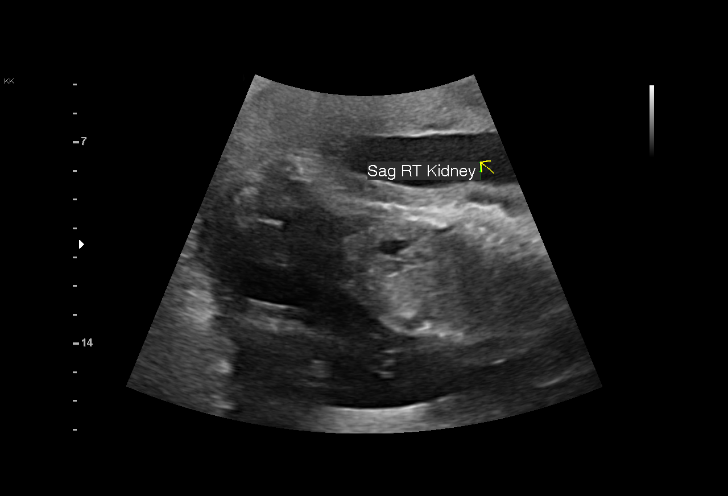
[im 23/55]
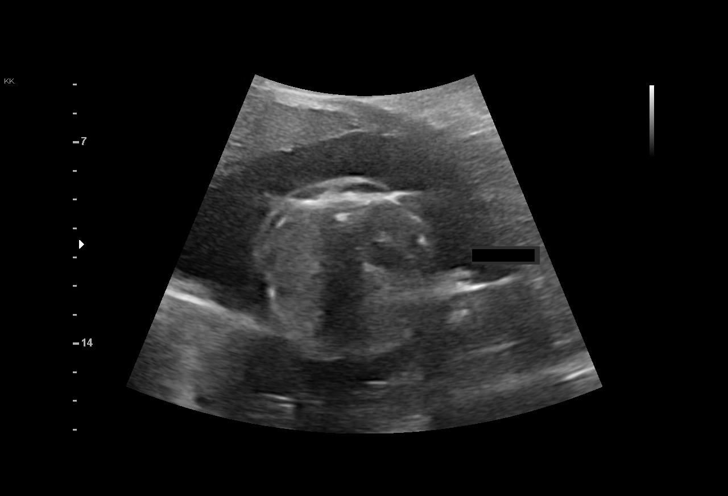
[im 27/55]
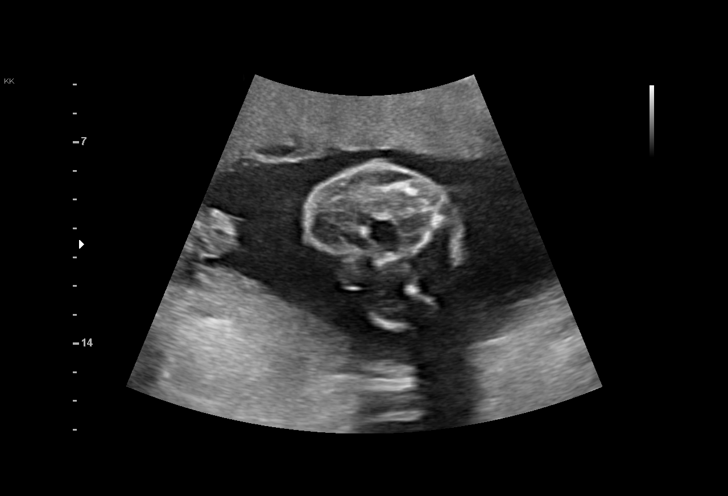
[im 31/55]
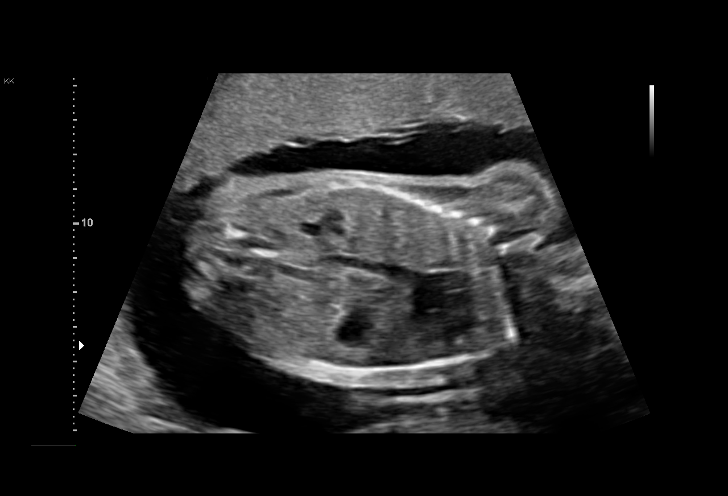
[im 35/55]
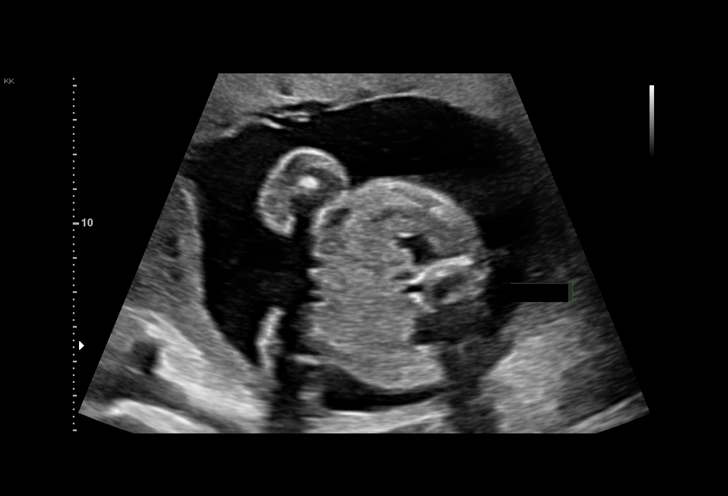
[im 39/55]
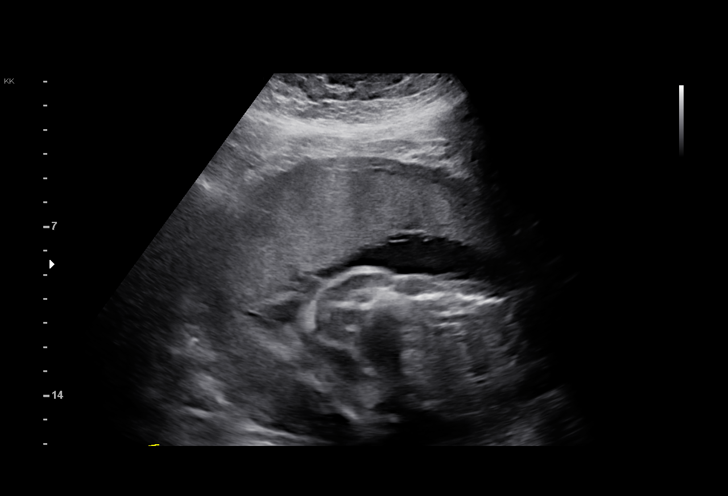
[im 43/55]
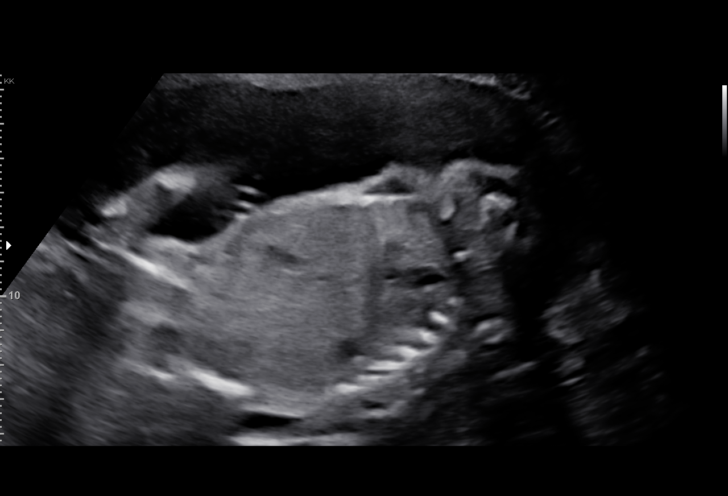
[im 47/55]
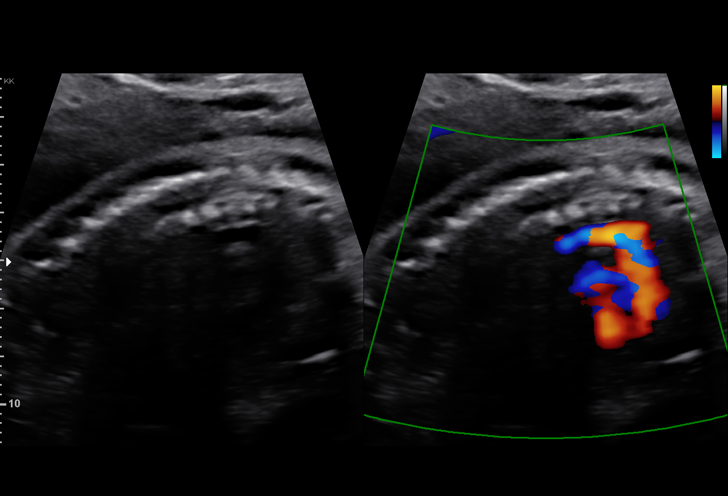
[im 51/55]
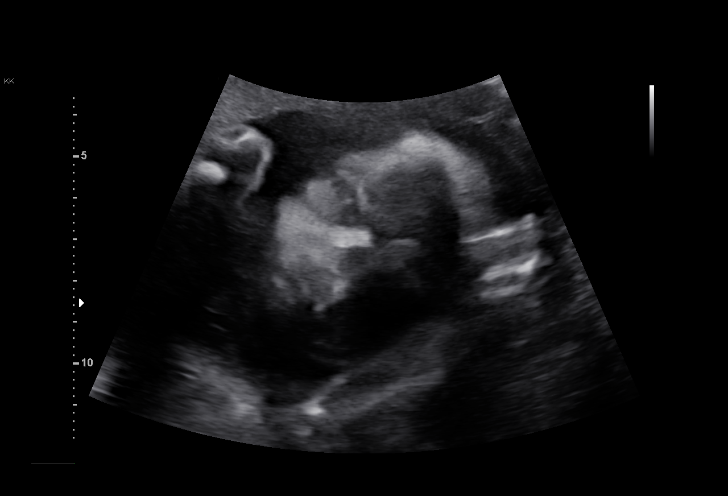
[im 55/55]
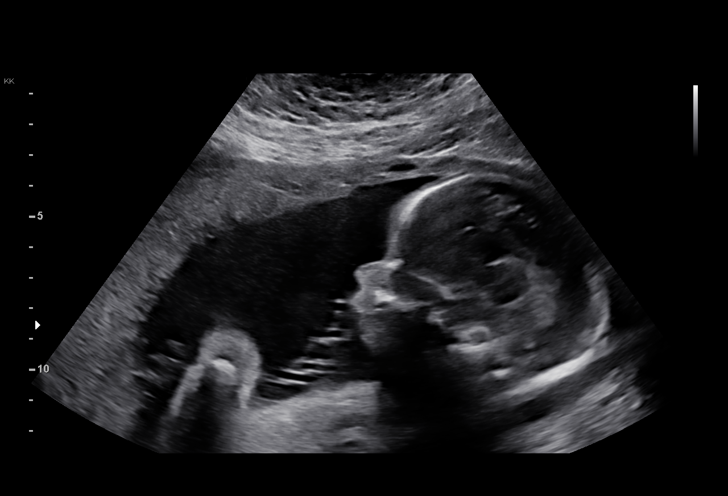

[14 of 28 positions shown; findings below may reference images not displayed]

1  MAEASH ABBASI              [id number]      [id number]     [id number]
Indications

23 weeks gestation of pregnancy
Pre-existing diabetes, type 2, in pregnancy,
second trimester (insulin)
Hypertension - Chronic/Pre-existing
Previous cesarean delivery, antepartum
Poor obstetric history: Previous preterm
delivery, antepartum (33 weeks)
Poor obstetric history: Previous preeclampsia
Abnormal biochemical screen (OSBR [DATE])
Encounter for other antenatal screening
follow-up
OB History

Gravidity:    3         Term:   0        Prem:   1        SAB:   1
TOP:          0       Ectopic:  0        Living: 1
Fetal Evaluation

Num Of Fetuses:     1
Fetal Heart         149
Rate(bpm):
Cardiac Activity:   Observed
Presentation:       Variable
Placenta:           Anterior, above cervical os
P. Cord Insertion:  Previously Visualized

Amniotic Fluid
AFI FV:      Subjectively within normal limits

Largest Pocket(cm)
6.3
Biometry

BPD:        62  mm     G. Age:  25w 1d         92  %    CI:        73.93   %    70 - 86
FL/HC:      18.5   %    18.7 -
HC:       229   mm     G. Age:  24w 6d         82  %    HC/AC:      1.12        1.05 -
AC:       205   mm     G. Age:  25w 1d         84  %    FL/BPD:     68.2   %    71 - 87
FL:       42.3  mm     G. Age:  23w 6d         46  %    FL/AC:      20.6   %    20 - 24

Est. FW:     715  gm      1 lb 9 oz     71  %
Gestational Age

U/S Today:     24w 5d                                        EDD:   05/29/18
Best:          23w 4d     Det. By:  Early Ultrasound         EDD:   06/06/18
(10/14/17)
Anatomy

Cranium:               Appears normal         Aortic Arch:            Not well visualized
Cavum:                 Previously seen        Ductal Arch:            Not well visualized
Ventricles:            Previously seen        Diaphragm:              Appears normal
Choroid Plexus:        Previously seen        Stomach:                Appears normal, left
sided
Cerebellum:            Previously seen        Abdomen:                Appears normal
Posterior Fossa:       Previously seen        Abdominal Wall:         Previously seen
Nuchal Fold:           Previously seen        Cord Vessels:           Previously seen
Face:                  Orbits appear          Kidneys:                Bilateral UTD
normal
Lips:                  Appears normal         Bladder:                Appears normal
Thoracic:              Appears normal         Spine:                  Previously seen
Heart:                 Previously seen        Upper Extremities:      Previously seen
RVOT:                  Previously seen        Lower Extremities:      Previously seen
LVOT:                  Previously seen

Other:  Fetus appears to be a male. Heels, left 5th digit, and Nasal bone
previously visualized. Technically difficult due to maternal habitus.
Cervix Uterus Adnexa

Cervix
Length:            3.7  cm.
Normal appearance by transabdominal scan.
Impression

Singleton intrauterine pregnancy at 23+4 weeks with type 2
DM, CHTN and obesity
Review of the anatomy shows continued bilateral UTD. No
other sonographic markers for aneuploidy or structural
anomalies are seen
However, views should be considered suboptimal secondary
to maternal body habitus
Amniotic fluid volume is normal
Estimated fetal weight shows growth in the 71st percentile
Recommendations

Repeat scan in 4 weeks due to DM and CHTN

## 2020-03-03 DIAGNOSIS — M722 Plantar fascial fibromatosis: Secondary | ICD-10-CM | POA: Diagnosis not present

## 2020-04-02 ENCOUNTER — Other Ambulatory Visit: Payer: Self-pay

## 2020-04-02 ENCOUNTER — Ambulatory Visit (INDEPENDENT_AMBULATORY_CARE_PROVIDER_SITE_OTHER): Payer: BC Managed Care – PPO | Admitting: Podiatry

## 2020-04-02 DIAGNOSIS — L600 Ingrowing nail: Secondary | ICD-10-CM

## 2020-04-02 DIAGNOSIS — M79676 Pain in unspecified toe(s): Secondary | ICD-10-CM

## 2020-04-02 MED ORDER — NEOMYCIN-POLYMYXIN-HC 3.5-10000-1 OT SOLN: OTIC | 0 refills | Status: DC

## 2020-04-02 NOTE — Patient Instructions (Signed)

## 2020-04-05 DIAGNOSIS — I10 Essential (primary) hypertension: Secondary | ICD-10-CM | POA: Diagnosis not present

## 2020-04-05 DIAGNOSIS — K219 Gastro-esophageal reflux disease without esophagitis: Secondary | ICD-10-CM | POA: Diagnosis not present

## 2020-04-05 DIAGNOSIS — F410 Panic disorder [episodic paroxysmal anxiety] without agoraphobia: Secondary | ICD-10-CM | POA: Diagnosis not present

## 2020-04-05 DIAGNOSIS — E785 Hyperlipidemia, unspecified: Secondary | ICD-10-CM | POA: Diagnosis not present

## 2020-04-14 NOTE — Progress Notes (Signed)
°  Subjective:  Patient ID: Emily Weber, female    DOB: September 18, 1986,  MRN: [id number]  Chief Complaint  Patient presents with   Nail Problem    Rt halux lateral border x 1 yr; 7/10 shapr pains -pt denioes injury/redness/swelling/driange -worse with whoes tx: none     34 y.o. female presents with the above complaint. History confirmed with patient.   Objective:  Physical Exam: warm, good capillary refill, no trophic changes or ulcerative lesions, normal DP and PT pulses and normal sensory exam.  Painful ingrowing nail at lateral border of the right, hallux; without warmth, erythema or drainage  Assessment:   1. Ingrown nail   2. Pain around toenail      Plan:  Patient was evaluated and treated and all questions answered.  Ingrown Nail, right -Patient elects to proceed with ingrown toenail removal today -Ingrown nail excised. See procedure note. -Educated on post-procedure care including soaking. Written instructions provided. -Rx for Cortisporin drops  Procedure: Excision of Ingrown Toenail Location: Right 1st toe lateral nail borders. Anesthesia: Lidocaine 1% plain; 1.41mL and Marcaine 0.5% plain; 1.68mL, digital block. Skin Prep: Alcohol. Dressing: Silvadene; telfa; dry, sterile, compression dressing. Technique: Following skin prep, the toe was exsanguinated and a tourniquet was secured at the base of the toe. The affected nail border was freed, split with a nail splitter, and excised. Chemical matrixectomy was then performed with phenol and irrigated out with alcohol. The tourniquet was then removed and sterile dressing applied. Disposition: Patient tolerated procedure well.   No follow-ups on file.   MDM

## 2020-04-23 ENCOUNTER — Ambulatory Visit: Payer: BC Managed Care – PPO | Admitting: Podiatry

## 2020-05-07 ENCOUNTER — Other Ambulatory Visit: Payer: Self-pay

## 2020-05-07 ENCOUNTER — Ambulatory Visit (INDEPENDENT_AMBULATORY_CARE_PROVIDER_SITE_OTHER): Payer: BC Managed Care – PPO | Admitting: Podiatry

## 2020-05-07 DIAGNOSIS — M79676 Pain in unspecified toe(s): Secondary | ICD-10-CM

## 2020-05-07 DIAGNOSIS — L600 Ingrowing nail: Secondary | ICD-10-CM

## 2020-05-07 NOTE — Progress Notes (Signed)
  Subjective:  Patient ID: Emily Weber, female    DOB: 1986/06/30,  MRN: [id number]  No chief complaint on file.  34 y.o. female presents for follow up of nail procedure. History confirmed with patient. States the nail is doing very well. States the top layer keeps peeling off. Denies drainage, pain, redness, swelling.  Objective:  Physical Exam: Ingrown nail avulsion site: overlying soft crust, no warmth, no drainage and no erythema Assessment:   1. Ingrown nail   2. Pain around toenail    Plan:  Patient was evaluated and treated and all questions answered.  S/p Ingrown Toenail Excision, right -Healing well without issue. -Discussed return precautions. -F/u PRN

## 2020-05-10 DIAGNOSIS — E1169 Type 2 diabetes mellitus with other specified complication: Secondary | ICD-10-CM | POA: Diagnosis not present

## 2020-05-10 DIAGNOSIS — I10 Essential (primary) hypertension: Secondary | ICD-10-CM | POA: Diagnosis not present

## 2020-05-10 DIAGNOSIS — E282 Polycystic ovarian syndrome: Secondary | ICD-10-CM | POA: Diagnosis not present

## 2020-05-22 DIAGNOSIS — K915 Postcholecystectomy syndrome: Secondary | ICD-10-CM | POA: Diagnosis not present

## 2020-05-22 DIAGNOSIS — R197 Diarrhea, unspecified: Secondary | ICD-10-CM | POA: Diagnosis not present

## 2020-05-22 DIAGNOSIS — Z6838 Body mass index (BMI) 38.0-38.9, adult: Secondary | ICD-10-CM | POA: Diagnosis not present

## 2020-07-02 DIAGNOSIS — F410 Panic disorder [episodic paroxysmal anxiety] without agoraphobia: Secondary | ICD-10-CM | POA: Diagnosis not present

## 2020-07-02 DIAGNOSIS — Z6836 Body mass index (BMI) 36.0-36.9, adult: Secondary | ICD-10-CM | POA: Diagnosis not present

## 2020-07-03 DIAGNOSIS — M5442 Lumbago with sciatica, left side: Secondary | ICD-10-CM | POA: Diagnosis not present

## 2020-07-05 DIAGNOSIS — M545 Low back pain: Secondary | ICD-10-CM | POA: Diagnosis not present

## 2020-07-12 DIAGNOSIS — M5442 Lumbago with sciatica, left side: Secondary | ICD-10-CM | POA: Diagnosis not present

## 2020-07-12 DIAGNOSIS — M5116 Intervertebral disc disorders with radiculopathy, lumbar region: Secondary | ICD-10-CM | POA: Diagnosis not present

## 2020-07-12 DIAGNOSIS — M5416 Radiculopathy, lumbar region: Secondary | ICD-10-CM | POA: Diagnosis not present

## 2020-07-26 DIAGNOSIS — M5416 Radiculopathy, lumbar region: Secondary | ICD-10-CM | POA: Diagnosis not present

## 2020-07-26 DIAGNOSIS — M5442 Lumbago with sciatica, left side: Secondary | ICD-10-CM | POA: Diagnosis not present

## 2020-07-30 DIAGNOSIS — H81399 Other peripheral vertigo, unspecified ear: Secondary | ICD-10-CM | POA: Diagnosis not present

## 2020-07-30 DIAGNOSIS — Z20828 Contact with and (suspected) exposure to other viral communicable diseases: Secondary | ICD-10-CM | POA: Diagnosis not present

## 2020-07-31 DIAGNOSIS — M5416 Radiculopathy, lumbar region: Secondary | ICD-10-CM | POA: Diagnosis not present

## 2020-07-31 DIAGNOSIS — M5442 Lumbago with sciatica, left side: Secondary | ICD-10-CM | POA: Diagnosis not present

## 2020-08-02 DIAGNOSIS — M5116 Intervertebral disc disorders with radiculopathy, lumbar region: Secondary | ICD-10-CM | POA: Diagnosis not present

## 2020-08-02 DIAGNOSIS — M5416 Radiculopathy, lumbar region: Secondary | ICD-10-CM | POA: Diagnosis not present

## 2020-08-02 DIAGNOSIS — R42 Dizziness and giddiness: Secondary | ICD-10-CM | POA: Diagnosis not present

## 2020-08-09 DIAGNOSIS — M5116 Intervertebral disc disorders with radiculopathy, lumbar region: Secondary | ICD-10-CM | POA: Diagnosis not present

## 2020-08-09 DIAGNOSIS — M5442 Lumbago with sciatica, left side: Secondary | ICD-10-CM | POA: Diagnosis not present

## 2020-08-16 DIAGNOSIS — M5416 Radiculopathy, lumbar region: Secondary | ICD-10-CM | POA: Diagnosis not present

## 2020-08-29 DIAGNOSIS — T380X5A Adverse effect of glucocorticoids and synthetic analogues, initial encounter: Secondary | ICD-10-CM | POA: Diagnosis not present

## 2020-08-29 DIAGNOSIS — Z87898 Personal history of other specified conditions: Secondary | ICD-10-CM | POA: Diagnosis not present

## 2020-08-29 DIAGNOSIS — R42 Dizziness and giddiness: Secondary | ICD-10-CM | POA: Diagnosis not present

## 2020-08-29 DIAGNOSIS — H9190 Unspecified hearing loss, unspecified ear: Secondary | ICD-10-CM | POA: Diagnosis not present

## 2020-09-11 DIAGNOSIS — I10 Essential (primary) hypertension: Secondary | ICD-10-CM | POA: Diagnosis not present

## 2020-09-11 DIAGNOSIS — E1169 Type 2 diabetes mellitus with other specified complication: Secondary | ICD-10-CM | POA: Diagnosis not present

## 2020-09-11 DIAGNOSIS — E669 Obesity, unspecified: Secondary | ICD-10-CM | POA: Diagnosis not present

## 2020-09-11 DIAGNOSIS — E282 Polycystic ovarian syndrome: Secondary | ICD-10-CM | POA: Diagnosis not present

## 2020-09-19 DIAGNOSIS — M5416 Radiculopathy, lumbar region: Secondary | ICD-10-CM | POA: Diagnosis not present

## 2020-10-01 DIAGNOSIS — Z6832 Body mass index (BMI) 32.0-32.9, adult: Secondary | ICD-10-CM | POA: Diagnosis not present

## 2020-10-01 DIAGNOSIS — K5733 Diverticulitis of large intestine without perforation or abscess with bleeding: Secondary | ICD-10-CM | POA: Diagnosis not present

## 2020-10-25 DIAGNOSIS — J329 Chronic sinusitis, unspecified: Secondary | ICD-10-CM | POA: Diagnosis not present

## 2020-10-25 DIAGNOSIS — J40 Bronchitis, not specified as acute or chronic: Secondary | ICD-10-CM | POA: Diagnosis not present

## 2020-10-25 DIAGNOSIS — Z20828 Contact with and (suspected) exposure to other viral communicable diseases: Secondary | ICD-10-CM | POA: Diagnosis not present

## 2020-12-20 DIAGNOSIS — R232 Flushing: Secondary | ICD-10-CM | POA: Diagnosis not present

## 2020-12-20 DIAGNOSIS — R7302 Impaired glucose tolerance (oral): Secondary | ICD-10-CM | POA: Diagnosis not present

## 2020-12-20 DIAGNOSIS — Z111 Encounter for screening for respiratory tuberculosis: Secondary | ICD-10-CM | POA: Diagnosis not present

## 2020-12-20 DIAGNOSIS — E785 Hyperlipidemia, unspecified: Secondary | ICD-10-CM | POA: Diagnosis not present

## 2020-12-27 DIAGNOSIS — Z6831 Body mass index (BMI) 31.0-31.9, adult: Secondary | ICD-10-CM | POA: Diagnosis not present

## 2020-12-27 DIAGNOSIS — R55 Syncope and collapse: Secondary | ICD-10-CM | POA: Diagnosis not present

## 2021-03-26 ENCOUNTER — Encounter: Payer: Self-pay | Admitting: Cardiology

## 2021-03-26 DIAGNOSIS — R002 Palpitations: Secondary | ICD-10-CM | POA: Diagnosis not present

## 2021-03-26 DIAGNOSIS — Z8639 Personal history of other endocrine, nutritional and metabolic disease: Secondary | ICD-10-CM | POA: Diagnosis not present

## 2021-04-01 DIAGNOSIS — N6452 Nipple discharge: Secondary | ICD-10-CM | POA: Diagnosis not present

## 2021-04-01 DIAGNOSIS — Z6832 Body mass index (BMI) 32.0-32.9, adult: Secondary | ICD-10-CM | POA: Diagnosis not present

## 2021-04-01 DIAGNOSIS — N6322 Unspecified lump in the left breast, upper inner quadrant: Secondary | ICD-10-CM | POA: Diagnosis not present

## 2021-04-23 DIAGNOSIS — N6311 Unspecified lump in the right breast, upper outer quadrant: Secondary | ICD-10-CM | POA: Diagnosis not present

## 2021-04-23 DIAGNOSIS — N6489 Other specified disorders of breast: Secondary | ICD-10-CM | POA: Diagnosis not present

## 2021-04-23 DIAGNOSIS — R922 Inconclusive mammogram: Secondary | ICD-10-CM | POA: Diagnosis not present

## 2021-04-23 DIAGNOSIS — N6322 Unspecified lump in the left breast, upper inner quadrant: Secondary | ICD-10-CM | POA: Diagnosis not present

## 2021-05-07 ENCOUNTER — Encounter: Payer: Self-pay | Admitting: Cardiology

## 2021-05-07 ENCOUNTER — Encounter: Payer: Self-pay | Admitting: *Deleted

## 2021-05-13 DIAGNOSIS — R8271 Bacteriuria: Secondary | ICD-10-CM | POA: Diagnosis not present

## 2021-05-13 DIAGNOSIS — N926 Irregular menstruation, unspecified: Secondary | ICD-10-CM | POA: Diagnosis not present

## 2021-05-16 DIAGNOSIS — N926 Irregular menstruation, unspecified: Secondary | ICD-10-CM | POA: Diagnosis not present

## 2021-05-17 DIAGNOSIS — F419 Anxiety disorder, unspecified: Secondary | ICD-10-CM | POA: Diagnosis not present

## 2021-05-17 DIAGNOSIS — F17210 Nicotine dependence, cigarettes, uncomplicated: Secondary | ICD-10-CM | POA: Diagnosis not present

## 2021-05-17 DIAGNOSIS — Z6833 Body mass index (BMI) 33.0-33.9, adult: Secondary | ICD-10-CM | POA: Diagnosis not present

## 2021-05-17 DIAGNOSIS — E119 Type 2 diabetes mellitus without complications: Secondary | ICD-10-CM | POA: Diagnosis not present

## 2021-05-17 DIAGNOSIS — R1032 Left lower quadrant pain: Secondary | ICD-10-CM | POA: Diagnosis not present

## 2021-05-17 DIAGNOSIS — R102 Pelvic and perineal pain: Secondary | ICD-10-CM | POA: Diagnosis not present

## 2021-05-17 DIAGNOSIS — I10 Essential (primary) hypertension: Secondary | ICD-10-CM | POA: Diagnosis not present

## 2021-05-17 DIAGNOSIS — N926 Irregular menstruation, unspecified: Secondary | ICD-10-CM | POA: Diagnosis not present

## 2021-05-22 DIAGNOSIS — N926 Irregular menstruation, unspecified: Secondary | ICD-10-CM | POA: Diagnosis not present

## 2021-05-23 DIAGNOSIS — Z3A01 Less than 8 weeks gestation of pregnancy: Secondary | ICD-10-CM | POA: Diagnosis not present

## 2021-05-23 DIAGNOSIS — R8271 Bacteriuria: Secondary | ICD-10-CM | POA: Diagnosis not present

## 2021-05-23 DIAGNOSIS — R109 Unspecified abdominal pain: Secondary | ICD-10-CM | POA: Diagnosis not present

## 2021-05-23 DIAGNOSIS — O26891 Other specified pregnancy related conditions, first trimester: Secondary | ICD-10-CM | POA: Diagnosis not present

## 2021-05-23 DIAGNOSIS — M79605 Pain in left leg: Secondary | ICD-10-CM | POA: Diagnosis not present

## 2021-05-23 DIAGNOSIS — Z3201 Encounter for pregnancy test, result positive: Secondary | ICD-10-CM | POA: Diagnosis not present

## 2021-05-23 DIAGNOSIS — O2391 Unspecified genitourinary tract infection in pregnancy, first trimester: Secondary | ICD-10-CM | POA: Diagnosis not present

## 2021-05-23 DIAGNOSIS — O2691 Pregnancy related conditions, unspecified, first trimester: Secondary | ICD-10-CM | POA: Diagnosis not present

## 2021-05-24 DIAGNOSIS — Z3A01 Less than 8 weeks gestation of pregnancy: Secondary | ICD-10-CM | POA: Diagnosis not present

## 2021-05-24 DIAGNOSIS — O26891 Other specified pregnancy related conditions, first trimester: Secondary | ICD-10-CM | POA: Diagnosis not present

## 2021-05-24 DIAGNOSIS — M79605 Pain in left leg: Secondary | ICD-10-CM | POA: Diagnosis not present

## 2021-05-26 DIAGNOSIS — R7989 Other specified abnormal findings of blood chemistry: Secondary | ICD-10-CM

## 2021-05-26 DIAGNOSIS — O200 Threatened abortion: Secondary | ICD-10-CM | POA: Diagnosis not present

## 2021-05-26 HISTORY — DX: Other specified abnormal findings of blood chemistry: R79.89

## 2021-06-03 DIAGNOSIS — E282 Polycystic ovarian syndrome: Secondary | ICD-10-CM | POA: Diagnosis not present

## 2021-06-03 DIAGNOSIS — E669 Obesity, unspecified: Secondary | ICD-10-CM | POA: Diagnosis not present

## 2021-06-03 DIAGNOSIS — Z6832 Body mass index (BMI) 32.0-32.9, adult: Secondary | ICD-10-CM | POA: Diagnosis not present

## 2021-06-03 DIAGNOSIS — Z331 Pregnant state, incidental: Secondary | ICD-10-CM | POA: Diagnosis not present

## 2021-06-03 DIAGNOSIS — G478 Other sleep disorders: Secondary | ICD-10-CM | POA: Diagnosis not present

## 2021-06-03 DIAGNOSIS — E349 Endocrine disorder, unspecified: Secondary | ICD-10-CM | POA: Diagnosis not present

## 2021-06-03 DIAGNOSIS — F410 Panic disorder [episodic paroxysmal anxiety] without agoraphobia: Secondary | ICD-10-CM | POA: Diagnosis not present

## 2021-06-03 DIAGNOSIS — O200 Threatened abortion: Secondary | ICD-10-CM | POA: Diagnosis not present

## 2021-06-06 ENCOUNTER — Other Ambulatory Visit: Payer: Self-pay

## 2021-06-09 DIAGNOSIS — F32A Depression, unspecified: Secondary | ICD-10-CM | POA: Diagnosis not present

## 2021-06-09 DIAGNOSIS — F428 Other obsessive-compulsive disorder: Secondary | ICD-10-CM | POA: Diagnosis not present

## 2021-06-09 DIAGNOSIS — F419 Anxiety disorder, unspecified: Secondary | ICD-10-CM | POA: Diagnosis not present

## 2021-06-10 ENCOUNTER — Encounter: Payer: Self-pay | Admitting: Cardiology

## 2021-06-10 ENCOUNTER — Ambulatory Visit (INDEPENDENT_AMBULATORY_CARE_PROVIDER_SITE_OTHER): Payer: BC Managed Care – PPO | Admitting: Cardiology

## 2021-06-10 ENCOUNTER — Other Ambulatory Visit: Payer: Self-pay

## 2021-06-10 DIAGNOSIS — R011 Cardiac murmur, unspecified: Secondary | ICD-10-CM

## 2021-06-10 DIAGNOSIS — F17210 Nicotine dependence, cigarettes, uncomplicated: Secondary | ICD-10-CM

## 2021-06-10 DIAGNOSIS — I4729 Other ventricular tachycardia: Secondary | ICD-10-CM | POA: Insufficient documentation

## 2021-06-10 DIAGNOSIS — I472 Ventricular tachycardia: Secondary | ICD-10-CM

## 2021-06-10 DIAGNOSIS — R079 Chest pain, unspecified: Secondary | ICD-10-CM

## 2021-06-10 HISTORY — DX: Chest pain, unspecified: R07.9

## 2021-06-10 HISTORY — DX: Morbid (severe) obesity due to excess calories: E66.01

## 2021-06-10 HISTORY — DX: Cardiac murmur, unspecified: R01.1

## 2021-06-10 HISTORY — DX: Nicotine dependence, cigarettes, uncomplicated: F17.210

## 2021-06-10 HISTORY — DX: Other ventricular tachycardia: I47.29

## 2021-06-10 NOTE — Progress Notes (Addendum)
Cardiology Office Note:    Date:  06/10/2021   ID:  [name], DOB 26-May-1986, MRN [id number]  PCP:  Street, Stephanie Coup, MD  Cardiologist:  Garwin Brothers, MD   Referring MD: [address] Stephanie Coup, *    ASSESSMENT:    1. Chest pain of uncertain etiology   2. Morbid obesity (HCC)   3. Cardiac murmur   4. Cigarette smoker   5. Nonsustained ventricular tachycardia (HCC)    PLAN:    In order of problems listed above:  Primary prevention stressed with the patient.  Importance of compliance with diet medication stressed and she vocalized understanding. Nonsustained ventricular tachycardia: 5 beat episode.  Overall unremarkable monitoring otherwise.  We will do exercise stress echo to assess and make sure that she does not have any significant underlying substrate for the symptoms also.  She wants to start an exercise program and this should help [place] understand her exercise capacity and also reassured her about starting such a program. Cardiac murmur: Echocardiogram will be done to assess murmur heard on auscultation. Palpitations: These have resolved.  TSH is fine.  I reviewed ZIO monitoring report.  She has not tolerated beta-blocker well in the past and I would refrain from it at this time. Obesity: Weight reduction stressed risks of obesity explained and diet was emphasized and she promises to do better. Cigarette smoker: I spent 5 minutes with the patient discussing solely about smoking. Smoking cessation was counseled. I suggested to the patient also different medications and pharmacological interventions. Patient is keen to try stopping on its own at this time. He will get back to me if he needs any further assistance in this matter. Patient will be seen in follow-up appointment in 6 months or earlier if the patient has any concerns   Medication Adjustments/Labs and Tests Ordered: Current medicines are reviewed at length with the patient today.  Concerns regarding medicines  are outlined above.  Orders Placed This Encounter  Procedures   EKG 12-Lead   ECHOCARDIOGRAM STRESS TEST   ECHOCARDIOGRAM COMPLETE    No orders of the defined types were placed in this encounter.    History of Present Illness:    Emily Weber is a 35 y.o. female who is being seen today for the evaluation of nonsustained ventricular tachycardia at the request of Street, Stephanie Coup, *.  She mentions to me that she was evaluated for palpitations and was found to have a 5 beat nonsustained ventricular tachycardia.  The ZIO report is here for me to evaluate.  She denies any chest pain orthopnea PND.  She lives in with her family and has 2 kids.  She leads a sedentary lifestyle.  No syncope or any such issues.  She occasionally has chest discomfort which is not related to exertion.  At the time of my evaluation, the patient is alert awake oriented and in no distress.  Past Medical History:  Diagnosis Date   Abnormal MSAFP (maternal serum alpha-fetoprotein), elevated 01/14/2018   4.06 MoM   Blood glucose abnormal 07/22/2016   Depression    Diabetes mellitus type 2 in obese (HCC) 08/03/2017   HgbA1C: 5.7 at [facility]   Dyslipidemia    Elevated AFP 12/26/2017   Spina Bifida risk 1:10 Referred to MFM for level II u/s and counseling.   Hypertension, benign    Insulin resistance 07/26/2017   Irregular periods 08/21/2016   Mild obstructive sleep apnea    Morbid obesity with BMI of 40.0-44.9, adult (HCC)  08/21/2016   Panic attack    PCOS (polycystic ovarian syndrome) 07/26/2017   Post-cholecystectomy syndrome    chronic loose stools   Preeclampsia, third trimester 04/06/2014   Pregnancy induced hypertension    Pyelonephritis    Reflux gastritis    Renal calculi 07/01/2017   UTI (lower urinary tract infection)    Vitamin D deficiency     Past Surgical History:  Procedure Laterality Date   CESAREAN SECTION  04/2014   CHOLECYSTECTOMY  04/2015   DILATION AND CURETTAGE OF UTERUS  2015    TONSILLECTOMY     URETERAL STENT PLACEMENT Bilateral 07/02/2017   Dr. Nicanor Bake at [facility]   VENTRAL HERNIA REPAIR  01/2020   Dr. Lequita Halt    Current Medications: Current Meds  Medication Sig   albuterol (VENTOLIN HFA) 108 (90 Base) MCG/ACT inhaler Inhale 1-2 puffs into the lungs every 4 (four) hours as needed for wheezing or shortness of breath.   desvenlafaxine (PRISTIQ) 100 MG 24 hr tablet Take 100 mg by mouth daily.   desvenlafaxine (PRISTIQ) 50 MG 24 hr tablet Take 1 tablet by mouth daily. In addition to 100mg  to equal 150mg  daily   LORazepam (ATIVAN) 1 MG tablet Take 0.5-1 mg by mouth every 8 (eight) hours as needed for anxiety.   Multiple Vitamin (MULTIVITAMIN) capsule Take 1 capsule by mouth daily.   omeprazole (PRILOSEC) 20 MG capsule Take 20 mg by mouth daily.   promethazine (PHENERGAN) 25 MG tablet Take 12.5-25 mg by mouth every 8 (eight) hours as needed for nausea/vomiting.   sertraline (ZOLOFT) 25 MG tablet Take 25 mg by mouth daily.     Allergies:   Patient has no known allergies.   Social History   Socioeconomic History   Marital status: Married    Spouse name: Not on file   Number of children: Not on file   Years of education: Not on file   Highest education level: Not on file  Occupational History   Not on file  Tobacco Use   Smoking status: Former    Types: Cigarettes    Quit date: 2019    Years since quitting: 3.6   Smokeless tobacco: Never  Substance and Sexual Activity   Alcohol use: No   Drug use: No   Sexual activity: Not on file  Other Topics Concern   Not on file  Social History Narrative   Not on file   Social Determinants of Health   Financial Resource Strain: Not on file  Food Insecurity: Not on file  Transportation Needs: Not on file  Physical Activity: Not on file  Stress: Not on file  Social Connections: Not on file     Family History: The patient's family history includes Depression in her mother; Diabetes in her  maternal grandfather, mother, and paternal grandfather; Hypertension in her mother; Skin cancer in her mother.  ROS:   Please see the history of present illness.    All other systems reviewed and are negative.  EKGs/Labs/Other Studies Reviewed:    The following studies were reviewed today: EKG reveals sinus rhythm and nonspecific ST-T changes.  ZIO monitor was reviewed by me.   Recent Labs: No results found for requested labs within last 8760 hours.  Recent Lipid Panel No results found for: CHOL, TRIG, HDL, CHOLHDL, VLDL, LDLCALC, LDLDIRECT  Physical Exam:    VS:  BP 128/82   Pulse 93   Ht 5\' 9"  (1.753 m)   Wt 225 lb 9.6 oz (102.3 kg)  SpO2 97%   BMI 33.32 kg/m     Wt Readings from Last 3 Encounters:  06/10/21 225 lb 9.6 oz (102.3 kg)  03/26/21 217 lb (98.4 kg)  05/03/18 280 lb (127 kg)     GEN: Patient is in no acute distress HEENT: Normal NECK: No JVD; No carotid bruits LYMPHATICS: No lymphadenopathy CARDIAC: S1 S2 regular, 2/6 systolic murmur at the apex. RESPIRATORY:  Clear to auscultation without rales, wheezing or rhonchi  ABDOMEN: Soft, non-tender, non-distended MUSCULOSKELETAL:  No edema; No deformity  SKIN: Warm and dry NEUROLOGIC:  Alert and oriented x 3 PSYCHIATRIC:  Normal affect    Signed, Garwin Brothers, MD  06/10/2021 4:12 PM    [facility]

## 2021-06-10 NOTE — Patient Instructions (Addendum)
Medication Instructions:  No medication changes. *If you need a refill on your cardiac medications before your next appointment, please call your pharmacy*   Lab Work: None ordered If you have labs (blood work) drawn today and your tests are completely normal, you will receive your results only by: MyChart Message (if you have MyChart) OR A paper copy in the mail If you have any lab test that is abnormal or we need to change your treatment, we will call you to review the results.   Testing/Procedures: Your physician has requested that you have an echocardiogram. Echocardiography is a painless test that uses sound waves to create images of your heart. It provides your doctor with information about the size and shape of your heart and how well your heart's chambers and valves are working. This procedure takes approximately one hour. There are no restrictions for this procedure.   Testing/Procedures: Stress Echocardiogram Information Sheet Instructions:  [facility].  Nothing to eat or drink after midnight.  Take your medications with you to the test as you may need them to complete the test.  Dress prepared to exercise.  Please bring all current prescription medications.   Follow-Up: At [facility], you and your health needs are our priority.  As part of our continuing mission to provide you with exceptional heart care, we have created designated Provider Care Teams.  These Care Teams include your primary Cardiologist (physician) and Advanced Practice Providers (APPs -  Physician Assistants and Nurse Practitioners) who all work together to provide you with the care you need, when you need it.  We recommend signing up for the patient portal called "MyChart".  Sign up information is provided on this After Visit Summary.  MyChart is used to connect with patients for Virtual Visits (Telemedicine).  Patients are able to view lab/test results, encounter  notes, upcoming appointments, etc.  Non-urgent messages can be sent to your provider as well.   To learn more about what you can do with MyChart, go to [email or web address].    Your next appointment:   4 month(s)  The format for your next appointment:   In Person  Provider:   Belva Cromeajan Revankar, MD   Other Instructions Echocardiogram An echocardiogram is a test that uses sound waves (ultrasound) to produce images of the heart. Images from an echocardiogram can provide important information about: Heart size and shape. The size and thickness and movement of your heart's walls. Heart muscle function and strength. Heart valve function or if you have stenosis. Stenosis is when the heart valves are too narrow. If blood is flowing backward through the heart valves (regurgitation). A tumor or infectious growth around the heart valves. Areas of heart muscle that are not working well because of poor blood flow or injury from a heart attack. Aneurysm detection. An aneurysm is a weak or damaged part of an artery wall. The wall bulges out from the normal force of blood pumping through the body. Tell a health care provider about: Any allergies you have. All medicines you are taking, including vitamins, herbs, eye drops, creams, and over-the-counter medicines. Any blood disorders you have. Any surgeries you have had. Any medical conditions you have. Whether you are pregnant or may be pregnant. What are the risks? Generally, this is a safe test. However, problems may occur, including an allergic reaction to dye (contrast) that may be used during the test. What happens before the test? No specific preparation is needed. You may  eat and drink normally. What happens during the test? You will take off your clothes from the waist up and put on a hospital gown. Electrodes or electrocardiogram (ECG)patches may be placed on your chest. The electrodes or patches are then connected to a device that  monitors your heart rate and rhythm. You will lie down on a table for an ultrasound exam. A gel will be applied to your chest to help sound waves pass through your skin. A handheld device, called a transducer, will be pressed against your chest and moved over your heart. The transducer produces sound waves that travel to your heart and bounce back (or "echo" back) to the transducer. These sound waves will be captured in real-time and changed into images of your heart that can be viewed on a video monitor. The images will be recorded on a computer and reviewed by your health care provider. You may be asked to change positions or hold your breath for a short time. This makes it easier to get different views or better views of your heart. In some cases, you may receive contrast through an IV in one of your veins. This can improve the quality of the pictures from your heart. The procedure may vary among health care providers and hospitals.   What can I expect after the test? You may return to your normal, everyday life, including diet, activities, and medicines, unless your health care provider tells you not to do that. Follow these instructions at home: It is up to you to get the results of your test. Ask your health care provider, or the department that is doing the test, when your results will be ready. Keep all follow-up visits. This is important. Summary An echocardiogram is a test that uses sound waves (ultrasound) to produce images of the heart. Images from an echocardiogram can provide important information about the size and shape of your heart, heart muscle function, heart valve function, and other possible heart problems. You do not need to do anything to prepare before this test. You may eat and drink normally. After the echocardiogram is completed, you may return to your normal, everyday life, unless your health care provider tells you not to do that. This information is not intended to  replace advice given to you by your health care provider. Make sure you discuss any questions you have with your health care provider. Document Revised: 05/21/2020 Document Reviewed: 05/21/2020 Elsevier Patient Education  2021 Elsevier Inc[employer]  Exercise Stress Echocardiogram An exercise stress echocardiogram is a test to check how well your heart is working. This test uses sound waves (ultrasound) and a computer to make images of your heart before and after exercise. Ultrasound images that are taken before you exercise (resting echocardiogram) will show how much blood is getting to your heart muscle and how well yourheart muscle and heart valves are functioning. During the next part of this test, you will walk on a treadmill or ride a stationary bicycle to see how exercise affects your heart. While you exercise, the electrical activity of your heart will be monitored with anelectrocardiogram (ECG). Your blood pressure will also be monitored. You may have this test if you have: Chest pain or other symptoms of a heart problem. Recently had a heart attack or heart surgery. Heart valve problems. A condition that causes narrowing of the blood vessels that supply your heart (coronary artery disease). A high risk of heart disease and are starting a new exercise program. A high risk  of heart disease and need to have major surgery. Heart arrhythmia (abnormal heart rhythm) problems. Heart failure problems. Tell a health care provider about: Any allergies you have. All medicines you are taking, including vitamins, herbs, eye drops, creams, and over-the-counter medicines. Any problems you or family members have had with anesthetic medicines. Any surgeries you have had. Any blood disorders you have. Any medical conditions you have. Whether you are pregnant or may be pregnant. What are the risks? Generally, this is a safe test. However, problems may occur, including: Chest pain. Dizziness or  light-headedness. Shortness of breath. Increased or irregular heartbeat (palpitations). Nausea or vomiting. Heart attack. This is very rare. What happens before the test? Medicines Ask your health care provider about changing or stopping your regular medicines. This is especially important if you are taking diabetes medicines or blood thinners. If you use an inhaler, bring it with you to the test. General instructions Wear loose, comfortable clothing and walking shoes. Follow instructions from your health care provider about eating or drinking restrictions. You may be asked to avoid all forms of caffeine for 24 hours before your test, or as told by your health care provider. Do not use any products that contain nicotine or tobacco for 4 hours before the test, or as told by your health care provider. These products include cigarettes, chewing tobacco, and vaping devices, such as e-cigarettes. If you need help quitting, ask your health care provider. What happens during the test?  You will take off your clothes from the waist up and put on a hospital gown. Electrodes or electrocardiogram (ECG) patches may be placed on your chest. The electrodes or patches are then connected to a device that monitors your heart rate and rhythm. A blood pressure cuff will be placed on your arm. You will lie down on a table for an ultrasound exam before you exercise. A gel will be applied to your chest to help sound waves pass through your skin. A handheld device, called a transducer, will be pressed against your chest and moved over your heart. The transducer produces sound waves that travel to your heart and bounce back (or "echo" back) to the transducer. These sound waves will be captured in real-time and changed into images of your heart that can be viewed on a video monitor. The images will be recorded on a computer and reviewed by your health care provider. Once the ultrasound is complete, you will start  exercising by walking on a treadmill or pedaling a stationary bicycle. Your blood pressure and heart rhythm will be monitored while you exercise. The exercise will gradually get harder or faster. You will exercise until: Your heart reaches a target level. You are too tired to continue. You cannot continue because of chest pain, weakness, or dizziness. You will have another ultrasound exam immediately after you stop exercising. The procedure may vary among health care providers and hospitals. What can I expect after the test? After your test, it is common to have: Mild soreness. Mild fatigue. Your heart rate and blood pressure will be monitored until they return to yournormal levels. You should not have any new symptoms after this test. Follow these instructions at home: After your stress test, you should be able to return to your normal activities and diet. Take over-the-counter and prescription medicines only as told by your health care provider. Keep all follow-up visits. This is important. It is up to you to get the results of your test. Ask your health care  provider, or the department that is doing the test, when your results will be ready. Contact a health care provider if you: Feel dizzy or light-headed. Have a fast or irregular heartbeat. Have nausea or vomiting. Have a headache. Feel short of breath. Get help right away if you: Develop pain or pressure: In your chest. In your jaw or neck. Between your shoulder blades. Radiating down your left arm. Faint. Have trouble breathing. These symptoms may represent a serious problem that is an emergency. Do not wait to see if the symptoms will go away. Get medical help right away. Call your local emergency services (911 in the [place] Do not drive yourself to the hospital. Summary An exercise stress echocardiogram is a test that uses ultrasound to check how well your heart works before and after exercise. Before the test, follow  instructions from your health care provider about stopping medicines and avoiding caffeine, nicotine and tobacco, and certain foods and drinks. During the test, your blood pressure and heart rhythm will be monitored while you exercise on a treadmill or stationary bicycle. This information is not intended to replace advice given to you by your health care provider. Make sure you discuss any questions you have with your healthcare provider. Document Revised: 05/21/2020 Document Reviewed: 05/21/2020 Elsevier Patient Education  2022 [employer].

## 2021-06-16 DIAGNOSIS — F428 Other obsessive-compulsive disorder: Secondary | ICD-10-CM | POA: Diagnosis not present

## 2021-06-16 DIAGNOSIS — F32A Depression, unspecified: Secondary | ICD-10-CM | POA: Diagnosis not present

## 2021-06-16 DIAGNOSIS — F419 Anxiety disorder, unspecified: Secondary | ICD-10-CM | POA: Diagnosis not present

## 2021-06-17 DIAGNOSIS — E669 Obesity, unspecified: Secondary | ICD-10-CM | POA: Diagnosis not present

## 2021-06-17 DIAGNOSIS — E1169 Type 2 diabetes mellitus with other specified complication: Secondary | ICD-10-CM | POA: Diagnosis not present

## 2021-06-17 DIAGNOSIS — R7989 Other specified abnormal findings of blood chemistry: Secondary | ICD-10-CM | POA: Diagnosis not present

## 2021-06-17 DIAGNOSIS — R002 Palpitations: Secondary | ICD-10-CM | POA: Diagnosis not present

## 2021-06-17 DIAGNOSIS — E282 Polycystic ovarian syndrome: Secondary | ICD-10-CM | POA: Diagnosis not present

## 2021-06-19 DIAGNOSIS — Z124 Encounter for screening for malignant neoplasm of cervix: Secondary | ICD-10-CM | POA: Diagnosis not present

## 2021-06-19 DIAGNOSIS — Z01419 Encounter for gynecological examination (general) (routine) without abnormal findings: Secondary | ICD-10-CM | POA: Diagnosis not present

## 2021-06-19 DIAGNOSIS — I10 Essential (primary) hypertension: Secondary | ICD-10-CM | POA: Diagnosis not present

## 2021-06-20 DIAGNOSIS — F410 Panic disorder [episodic paroxysmal anxiety] without agoraphobia: Secondary | ICD-10-CM | POA: Diagnosis not present

## 2021-06-20 DIAGNOSIS — N921 Excessive and frequent menstruation with irregular cycle: Secondary | ICD-10-CM | POA: Diagnosis not present

## 2021-06-20 DIAGNOSIS — N926 Irregular menstruation, unspecified: Secondary | ICD-10-CM | POA: Diagnosis not present

## 2021-06-20 DIAGNOSIS — N643 Galactorrhea not associated with childbirth: Secondary | ICD-10-CM | POA: Diagnosis not present

## 2021-06-20 DIAGNOSIS — E669 Obesity, unspecified: Secondary | ICD-10-CM | POA: Diagnosis not present

## 2021-06-24 ENCOUNTER — Telehealth: Payer: Self-pay | Admitting: Cardiology

## 2021-06-24 DIAGNOSIS — R079 Chest pain, unspecified: Secondary | ICD-10-CM | POA: Diagnosis not present

## 2021-06-24 NOTE — Telephone Encounter (Signed)
Patient states that she had a stress test and echo done at the hospital and wants to know if she should still come in for her echo tomorrow, please advise.

## 2021-06-24 NOTE — Telephone Encounter (Signed)
Spoke with pt and advised that the echo is different than the stress echo. Pt verbalized understanding and had no additional questions.

## 2021-06-26 ENCOUNTER — Ambulatory Visit (INDEPENDENT_AMBULATORY_CARE_PROVIDER_SITE_OTHER): Payer: BC Managed Care – PPO

## 2021-06-26 ENCOUNTER — Other Ambulatory Visit: Payer: Self-pay

## 2021-06-26 DIAGNOSIS — R079 Chest pain, unspecified: Secondary | ICD-10-CM | POA: Diagnosis not present

## 2021-06-26 LAB — ECHOCARDIOGRAM COMPLETE
Area-P 1/2: 4.36 cm2
S' Lateral: 3.2 cm

## 2021-06-27 ENCOUNTER — Telehealth: Payer: Self-pay

## 2021-06-27 NOTE — Telephone Encounter (Signed)
Spoke with patient regarding results and recommendation.  Patient verbalizes understanding and is agreeable to plan of care. Advised patient to call back with any issues or concerns.  

## 2021-06-27 NOTE — Telephone Encounter (Signed)
-----   Message from Garwin Brothers, MD sent at 06/26/2021  4:50 PM EDT ----- The results of the study is unremarkable. Please inform patient. I will discuss in detail at next appointment. Cc  primary care/referring physician Garwin Brothers, MD 06/26/2021 4:50 PM

## 2021-06-30 DIAGNOSIS — F419 Anxiety disorder, unspecified: Secondary | ICD-10-CM | POA: Diagnosis not present

## 2021-06-30 DIAGNOSIS — F32A Depression, unspecified: Secondary | ICD-10-CM | POA: Diagnosis not present

## 2021-06-30 DIAGNOSIS — F428 Other obsessive-compulsive disorder: Secondary | ICD-10-CM | POA: Diagnosis not present

## 2021-07-07 DIAGNOSIS — F428 Other obsessive-compulsive disorder: Secondary | ICD-10-CM | POA: Diagnosis not present

## 2021-07-07 DIAGNOSIS — F32A Depression, unspecified: Secondary | ICD-10-CM | POA: Diagnosis not present

## 2021-07-07 DIAGNOSIS — F419 Anxiety disorder, unspecified: Secondary | ICD-10-CM | POA: Diagnosis not present

## 2021-07-08 ENCOUNTER — Telehealth: Payer: Self-pay

## 2021-07-08 NOTE — Telephone Encounter (Signed)
Results reviewed with pt as per Dr. Revankar's note.  Pt verbalized understanding and had no additional questions.   

## 2021-07-14 DIAGNOSIS — F411 Generalized anxiety disorder: Secondary | ICD-10-CM | POA: Diagnosis not present

## 2021-07-14 DIAGNOSIS — F331 Major depressive disorder, recurrent, moderate: Secondary | ICD-10-CM | POA: Diagnosis not present

## 2021-07-14 DIAGNOSIS — F429 Obsessive-compulsive disorder, unspecified: Secondary | ICD-10-CM | POA: Diagnosis not present

## 2021-07-21 DIAGNOSIS — F411 Generalized anxiety disorder: Secondary | ICD-10-CM | POA: Diagnosis not present

## 2021-07-21 DIAGNOSIS — F331 Major depressive disorder, recurrent, moderate: Secondary | ICD-10-CM | POA: Diagnosis not present

## 2021-07-21 DIAGNOSIS — F429 Obsessive-compulsive disorder, unspecified: Secondary | ICD-10-CM | POA: Diagnosis not present

## 2021-07-23 DIAGNOSIS — E039 Hypothyroidism, unspecified: Secondary | ICD-10-CM | POA: Diagnosis not present

## 2021-07-23 DIAGNOSIS — E539 Vitamin B deficiency, unspecified: Secondary | ICD-10-CM | POA: Diagnosis not present

## 2021-07-23 DIAGNOSIS — N951 Menopausal and female climacteric states: Secondary | ICD-10-CM | POA: Diagnosis not present

## 2021-07-23 DIAGNOSIS — E559 Vitamin D deficiency, unspecified: Secondary | ICD-10-CM | POA: Diagnosis not present

## 2021-07-28 DIAGNOSIS — F429 Obsessive-compulsive disorder, unspecified: Secondary | ICD-10-CM | POA: Diagnosis not present

## 2021-07-28 DIAGNOSIS — F411 Generalized anxiety disorder: Secondary | ICD-10-CM | POA: Diagnosis not present

## 2021-07-28 DIAGNOSIS — F331 Major depressive disorder, recurrent, moderate: Secondary | ICD-10-CM | POA: Diagnosis not present

## 2021-08-04 DIAGNOSIS — F411 Generalized anxiety disorder: Secondary | ICD-10-CM | POA: Diagnosis not present

## 2021-08-04 DIAGNOSIS — F331 Major depressive disorder, recurrent, moderate: Secondary | ICD-10-CM | POA: Diagnosis not present

## 2021-08-04 DIAGNOSIS — F429 Obsessive-compulsive disorder, unspecified: Secondary | ICD-10-CM | POA: Diagnosis not present

## 2021-08-11 DIAGNOSIS — F411 Generalized anxiety disorder: Secondary | ICD-10-CM | POA: Diagnosis not present

## 2021-08-11 DIAGNOSIS — F429 Obsessive-compulsive disorder, unspecified: Secondary | ICD-10-CM | POA: Diagnosis not present

## 2021-08-11 DIAGNOSIS — F331 Major depressive disorder, recurrent, moderate: Secondary | ICD-10-CM | POA: Diagnosis not present

## 2021-08-18 DIAGNOSIS — F411 Generalized anxiety disorder: Secondary | ICD-10-CM | POA: Diagnosis not present

## 2021-08-18 DIAGNOSIS — F331 Major depressive disorder, recurrent, moderate: Secondary | ICD-10-CM | POA: Diagnosis not present

## 2021-08-18 DIAGNOSIS — F429 Obsessive-compulsive disorder, unspecified: Secondary | ICD-10-CM | POA: Diagnosis not present

## 2021-08-25 DIAGNOSIS — F429 Obsessive-compulsive disorder, unspecified: Secondary | ICD-10-CM | POA: Diagnosis not present

## 2021-08-25 DIAGNOSIS — F331 Major depressive disorder, recurrent, moderate: Secondary | ICD-10-CM | POA: Diagnosis not present

## 2021-08-25 DIAGNOSIS — F411 Generalized anxiety disorder: Secondary | ICD-10-CM | POA: Diagnosis not present

## 2021-09-08 DIAGNOSIS — F411 Generalized anxiety disorder: Secondary | ICD-10-CM | POA: Diagnosis not present

## 2021-09-08 DIAGNOSIS — F331 Major depressive disorder, recurrent, moderate: Secondary | ICD-10-CM | POA: Diagnosis not present

## 2021-09-08 DIAGNOSIS — F429 Obsessive-compulsive disorder, unspecified: Secondary | ICD-10-CM | POA: Diagnosis not present

## 2021-09-15 DIAGNOSIS — F411 Generalized anxiety disorder: Secondary | ICD-10-CM | POA: Diagnosis not present

## 2021-09-15 DIAGNOSIS — F331 Major depressive disorder, recurrent, moderate: Secondary | ICD-10-CM | POA: Diagnosis not present

## 2021-09-15 DIAGNOSIS — F429 Obsessive-compulsive disorder, unspecified: Secondary | ICD-10-CM | POA: Diagnosis not present

## 2021-09-23 DIAGNOSIS — F429 Obsessive-compulsive disorder, unspecified: Secondary | ICD-10-CM | POA: Diagnosis not present

## 2021-09-23 DIAGNOSIS — F411 Generalized anxiety disorder: Secondary | ICD-10-CM | POA: Diagnosis not present

## 2021-09-23 DIAGNOSIS — F331 Major depressive disorder, recurrent, moderate: Secondary | ICD-10-CM | POA: Diagnosis not present

## 2021-09-29 DIAGNOSIS — F429 Obsessive-compulsive disorder, unspecified: Secondary | ICD-10-CM | POA: Diagnosis not present

## 2021-09-29 DIAGNOSIS — F411 Generalized anxiety disorder: Secondary | ICD-10-CM | POA: Diagnosis not present

## 2021-09-29 DIAGNOSIS — F331 Major depressive disorder, recurrent, moderate: Secondary | ICD-10-CM | POA: Diagnosis not present

## 2021-10-10 ENCOUNTER — Ambulatory Visit: Payer: PRIVATE HEALTH INSURANCE | Admitting: Cardiology

## 2021-10-14 DIAGNOSIS — F429 Obsessive-compulsive disorder, unspecified: Secondary | ICD-10-CM | POA: Diagnosis not present

## 2021-10-14 DIAGNOSIS — F331 Major depressive disorder, recurrent, moderate: Secondary | ICD-10-CM | POA: Diagnosis not present

## 2021-10-14 DIAGNOSIS — F411 Generalized anxiety disorder: Secondary | ICD-10-CM | POA: Diagnosis not present

## 2021-10-20 DIAGNOSIS — F429 Obsessive-compulsive disorder, unspecified: Secondary | ICD-10-CM | POA: Diagnosis not present

## 2021-10-20 DIAGNOSIS — F411 Generalized anxiety disorder: Secondary | ICD-10-CM | POA: Diagnosis not present

## 2021-10-20 DIAGNOSIS — F331 Major depressive disorder, recurrent, moderate: Secondary | ICD-10-CM | POA: Diagnosis not present

## 2021-10-29 DIAGNOSIS — F411 Generalized anxiety disorder: Secondary | ICD-10-CM | POA: Diagnosis not present

## 2021-10-29 DIAGNOSIS — F331 Major depressive disorder, recurrent, moderate: Secondary | ICD-10-CM | POA: Diagnosis not present

## 2021-10-29 DIAGNOSIS — F429 Obsessive-compulsive disorder, unspecified: Secondary | ICD-10-CM | POA: Diagnosis not present

## 2021-10-29 DIAGNOSIS — F419 Anxiety disorder, unspecified: Secondary | ICD-10-CM | POA: Diagnosis not present

## 2021-11-03 DIAGNOSIS — E785 Hyperlipidemia, unspecified: Secondary | ICD-10-CM | POA: Insufficient documentation

## 2021-11-03 DIAGNOSIS — I10 Essential (primary) hypertension: Secondary | ICD-10-CM | POA: Insufficient documentation

## 2021-11-03 DIAGNOSIS — F410 Panic disorder [episodic paroxysmal anxiety] without agoraphobia: Secondary | ICD-10-CM | POA: Insufficient documentation

## 2021-11-03 DIAGNOSIS — K915 Postcholecystectomy syndrome: Secondary | ICD-10-CM | POA: Insufficient documentation

## 2021-11-03 DIAGNOSIS — G4733 Obstructive sleep apnea (adult) (pediatric): Secondary | ICD-10-CM | POA: Insufficient documentation

## 2021-11-03 DIAGNOSIS — F32A Depression, unspecified: Secondary | ICD-10-CM | POA: Insufficient documentation

## 2021-11-03 DIAGNOSIS — O139 Gestational [pregnancy-induced] hypertension without significant proteinuria, unspecified trimester: Secondary | ICD-10-CM | POA: Insufficient documentation

## 2021-11-03 DIAGNOSIS — K2960 Other gastritis without bleeding: Secondary | ICD-10-CM | POA: Insufficient documentation

## 2021-11-03 DIAGNOSIS — E559 Vitamin D deficiency, unspecified: Secondary | ICD-10-CM | POA: Insufficient documentation

## 2021-11-04 ENCOUNTER — Other Ambulatory Visit: Payer: Self-pay

## 2021-11-04 ENCOUNTER — Ambulatory Visit (INDEPENDENT_AMBULATORY_CARE_PROVIDER_SITE_OTHER): Payer: BC Managed Care – PPO | Admitting: Cardiology

## 2021-11-04 ENCOUNTER — Encounter: Payer: Self-pay | Admitting: Cardiology

## 2021-11-04 VITALS — BP 120/76 | HR 84 | Ht 69.0 in | Wt 244.8 lb

## 2021-11-04 DIAGNOSIS — E1169 Type 2 diabetes mellitus with other specified complication: Secondary | ICD-10-CM | POA: Diagnosis not present

## 2021-11-04 DIAGNOSIS — I10 Essential (primary) hypertension: Secondary | ICD-10-CM

## 2021-11-04 DIAGNOSIS — E785 Hyperlipidemia, unspecified: Secondary | ICD-10-CM

## 2021-11-04 DIAGNOSIS — I4729 Other ventricular tachycardia: Secondary | ICD-10-CM | POA: Diagnosis not present

## 2021-11-04 DIAGNOSIS — E669 Obesity, unspecified: Secondary | ICD-10-CM | POA: Diagnosis not present

## 2021-11-04 DIAGNOSIS — E559 Vitamin D deficiency, unspecified: Secondary | ICD-10-CM | POA: Diagnosis not present

## 2021-11-04 NOTE — Progress Notes (Signed)
Cardiology Office Note:    Date:  11/04/2021   ID:  Emily PortsMikala S Weber, DOB 03-15-1986, MRN [id number]  PCP:  Street, Stephanie Couphristopher M, MD  Cardiologist:  Garwin Brothersajan R Oree Mirelez, MD   Referring MD: [address], Stephanie Couphristopher M, *    ASSESSMENT:    1. Hypertension, benign   2. Nonsustained ventricular tachycardia   3. Diabetes mellitus type 2 in obese (HCC)   4. Vitamin D deficiency   5. Dyslipidemia    PLAN:    In order of problems listed above:  Primary prevention stressed with the patient.  Importance of compliance with diet medication stressed and she vocalized understanding. Nonsustained ventricular tachycardia: Stable at this time.  She has been evaluated for this in the past.  She will have blood work today. Obesity: Weight reduction stressed diet emphasized lifestyle modification urged we will do her blood work including lipids today. Patient also requests hemoglobin A1c and vitamin D measurements and we will do her blood work for this and send to primary care.  She has an appointment coming up with primary care in the next few weeks. Patient will be seen in follow-up appointment in 6 months or earlier if the patient has any concerns    Medication Adjustments/Labs and Tests Ordered: Current medicines are reviewed at length with the patient today.  Concerns regarding medicines are outlined above.  Orders Placed This Encounter  Procedures   Basic metabolic panel   CBC with Differential/Platelet   Hepatic function panel   Hemoglobin A1c   Lipid panel   TSH   VITAMIN D 25 Hydroxy (Vit-D Deficiency, Fractures)   No orders of the defined types were placed in this encounter.    No chief complaint on file.    History of Present Illness:    Emily Weber is a 36 y.o. female.  Patient has past medical history of nonsustained ventricular tachycardia, morbid obesity and cigarette smoking.  She leads a sedentary lifestyle.  Unfortunately she continues to smoke.  No chest pain orthopnea  PND palpitations dizziness or any syncopal spells.  At the time of my evaluation, the patient is alert awake oriented and in no distress.  Past Medical History:  Diagnosis Date   [redacted] weeks gestation of pregnancy    Abnormal MSAFP (maternal serum alpha-fetoprotein), elevated 01/14/2018   4.06 MoM   Blood glucose abnormal 07/22/2016   Cardiac murmur 06/10/2021   Chest pain of uncertain etiology 06/10/2021   Cigarette smoker 06/10/2021   Depression    Diabetes mellitus (HCC) 07/01/2017   Diabetes mellitus type 2 in obese (HCC) 08/03/2017   HgbA1C: 5.7 at [facility]   Dyslipidemia    Elevated AFP 12/26/2017   Spina Bifida risk 1:10 Referred to MFM for level II u/s and counseling.   History of cesarean section 10/27/2017   Desires TOL, op note requested   Hypertension 07/01/2017   Hypertension, benign    Insulin resistance 07/26/2017   Irregular periods 08/21/2016   Mild obstructive sleep apnea    Morbid obesity (HCC) 06/10/2021   Morbid obesity with BMI of 40.0-44.9, adult (HCC) 08/21/2016   Nonsustained ventricular tachycardia 06/10/2021   Panic attack    PCOS (polycystic ovarian syndrome) 07/26/2017   Post-cholecystectomy syndrome    chronic loose stools   Preeclampsia, third trimester 04/06/2014   Pregnancy induced hypertension    Pyelonephritis    Reflux gastritis    Renal calculi 07/01/2017   Smoker 08/21/2016   Vitamin D deficiency     Past Surgical History:  Procedure Laterality Date   CESAREAN SECTION  04/2014   CHOLECYSTECTOMY  04/2015   DILATION AND CURETTAGE OF UTERUS  2015   TONSILLECTOMY     URETERAL STENT PLACEMENT Bilateral 07/02/2017   [facility]icanor [facility]   VENTRAL HERNIA REPAIR  01/2020   Dr. Lequita Halt    Current Medications: Current Meds  Medication Sig   albuterol (VENTOLIN HFA) 108 (90 Base) MCG/ACT inhaler Inhale 1-2 puffs into the lungs every 4 (four) hours as needed for wheezing or shortness of breath.   ALPRAZolam (XANAX) 0.25 MG tablet Take  0.25 mg by mouth daily as needed for anxiety.   FLUoxetine (PROZAC) 10 MG capsule Take 10 mg by mouth daily.   Multiple Vitamin (MULTIVITAMIN) capsule Take 1 capsule by mouth daily.   omeprazole (PRILOSEC) 20 MG capsule Take 20 mg by mouth daily.   OZEMPIC, 0.25 OR 0.5 MG/DOSE, 2 MG/1.5ML SOPN Inject 0.5 mg into the skin once a week.   pantoprazole (PROTONIX) 40 MG tablet Take 40 mg by mouth 2 (two) times daily.   progesterone (PROMETRIUM) 200 MG capsule Take 200 mg by mouth at bedtime.   promethazine (PHENERGAN) 25 MG tablet Take 12.5-25 mg by mouth every 8 (eight) hours as needed for nausea/vomiting.     Allergies:   Patient has no known allergies.   Social History   Socioeconomic History   Marital status: Married    Spouse name: Not on file   Number of children: Not on file   Years of education: Not on file   Highest education level: Not on file  Occupational History   Not on file  Tobacco Use   Smoking status: Former    Types: Cigarettes    Quit date: 2019    Years since quitting: 4.0   Smokeless tobacco: Never  Substance and Sexual Activity   Alcohol use: No   Drug use: No   Sexual activity: Not on file  Other Topics Concern   Not on file  Social History Narrative   Not on file   Social Determinants of Health   Financial Resource Strain: Not on file  Food Insecurity: Not on file  Transportation Needs: Not on file  Physical Activity: Not on file  Stress: Not on file  Social Connections: Not on file     Family History: The patient's family history includes Depression in her mother; Diabetes in her maternal grandfather, mother, and paternal grandfather; Hypertension in her mother; Skin cancer in her mother.  ROS:   Please see the history of present illness.    All other systems reviewed and are negative.  EKGs/Labs/Other Studies Reviewed:    The following studies were reviewed today: I discussed my findings with the patient at length.   Recent Labs: No  results found for requested labs within last 8760 hours.  Recent Lipid Panel No results found for: CHOL, TRIG, HDL, CHOLHDL, VLDL, LDLCALC, LDLDIRECT  Physical Exam:    VS:  BP 120/76    Pulse 84    Ht 5\' 9"  (1.753 m)    Wt 244 lb 12.8 oz (111 kg)    SpO2 97%    BMI 36.15 kg/m     Wt Readings from Last 3 Encounters:  11/04/21 244 lb 12.8 oz (111 kg)  06/10/21 225 lb 9.6 oz (102.3 kg)  03/26/21 217 lb (98.4 kg)     GEN: Patient is in no acute distress HEENT: Normal NECK: No JVD; No carotid bruits LYMPHATICS: No lymphadenopathy CARDIAC:  Hear sounds regular, 2/6 systolic murmur at the apex. RESPIRATORY:  Clear to auscultation without rales, wheezing or rhonchi  ABDOMEN: Soft, non-tender, non-distended MUSCULOSKELETAL:  No edema; No deformity  SKIN: Warm and dry NEUROLOGIC:  Alert and oriented x 3 PSYCHIATRIC:  Normal affect   Signed, Garwin Brothers, MD  11/04/2021 10:43 AM    [facility]

## 2021-11-04 NOTE — Patient Instructions (Signed)
Medication Instructions:  ?Your physician recommends that you continue on your current medications as directed. Please refer to the Current Medication list given to you today. ? ?*If you need a refill on your cardiac medications before your next appointment, please call your pharmacy* ? ? ?Lab Work: ?Your physician recommends that you have labs done in the office today. Your test included  basic metabolic panel, complete blood count, TSH, liver function and lipids. ? ?If you have labs (blood work) drawn today and your tests are completely normal, you will receive your results only by: ?MyChart Message (if you have MyChart) OR ?A paper copy in the mail ?If you have any lab test that is abnormal or we need to change your treatment, we will call you to review the results. ? ? ?Testing/Procedures: ?None ordered ? ? ?Follow-Up: ?At [facility], you and your health needs are our priority.  As part of our continuing mission to provide you with exceptional heart care, we have created designated Provider Care Teams.  These Care Teams include your primary Cardiologist (physician) and Advanced Practice Providers (APPs -  Physician Assistants and Nurse Practitioners) who all work together to provide you with the care you need, when you need it. ? ?We recommend signing up for the patient portal called "MyChart".  Sign up information is provided on this After Visit Summary.  MyChart is used to connect with patients for Virtual Visits (Telemedicine).  Patients are able to view lab/test results, encounter notes, upcoming appointments, etc.  Non-urgent messages can be sent to your provider as well.   ?To learn more about what you can do with MyChart, go to [email or web address].   ? ?Your next appointment:   ?12 month(s) ? ?The format for your next appointment:   ?In Person ? ?Provider:   ?Rajan Revankar, MD ? ? ?Other Instructions ?NA   ?

## 2021-11-05 LAB — CBC WITH DIFFERENTIAL/PLATELET
Basophils Absolute: 0.1 10*3/uL (ref 0.0–0.2)
Basos: 1 %
EOS (ABSOLUTE): 0.3 10*3/uL (ref 0.0–0.4)
Eos: 3 %
Hematocrit: 40.1 % (ref 34.0–46.6)
Hemoglobin: 14 g/dL (ref 11.1–15.9)
Immature Grans (Abs): 0.1 10*3/uL (ref 0.0–0.1)
Immature Granulocytes: 1 %
Lymphocytes Absolute: 2.9 10*3/uL (ref 0.7–3.1)
Lymphs: 38 %
MCH: 32 pg (ref 26.6–33.0)
MCHC: 34.9 g/dL (ref 31.5–35.7)
MCV: 92 fL (ref 79–97)
Monocytes Absolute: 0.6 10*3/uL (ref 0.1–0.9)
Monocytes: 8 %
Neutrophils Absolute: 3.7 10*3/uL (ref 1.4–7.0)
Neutrophils: 49 %
Platelets: 243 10*3/uL (ref 150–450)
RBC: 4.37 x10E6/uL (ref 3.77–5.28)
RDW: 12.3 % (ref 11.7–15.4)
WBC: 7.5 10*3/uL (ref 3.4–10.8)

## 2021-11-05 LAB — BASIC METABOLIC PANEL
BUN/Creatinine Ratio: 22 (ref 9–23)
BUN: 12 mg/dL (ref 6–20)
CO2: 25 mmol/L (ref 20–29)
Calcium: 9.4 mg/dL (ref 8.7–10.2)
Chloride: 102 mmol/L (ref 96–106)
Creatinine, Ser: 0.55 mg/dL — ABNORMAL LOW (ref 0.57–1.00)
Glucose: 74 mg/dL (ref 70–99)
Potassium: 4.7 mmol/L (ref 3.5–5.2)
Sodium: 139 mmol/L (ref 134–144)
eGFR: 124 mL/min/{1.73_m2} (ref >59)

## 2021-11-05 LAB — TSH: TSH: 0.785 u[IU]/mL (ref 0.450–4.500)

## 2021-11-05 LAB — HEPATIC FUNCTION PANEL
ALT: 18 IU/L (ref 0–32)
AST: 18 IU/L (ref 0–40)
Albumin: 4.2 g/dL (ref 3.8–4.8)
Alkaline Phosphatase: 52 IU/L (ref 44–121)
Bilirubin Total: <0.2 mg/dL (ref 0.0–1.2)
Bilirubin, Direct: <0.1 mg/dL (ref 0.00–0.40)
Total Protein: 6.6 g/dL (ref 6.0–8.5)

## 2021-11-05 LAB — LIPID PANEL
Chol/HDL Ratio: 4.9 ratio — ABNORMAL HIGH (ref 0.0–4.4)
Cholesterol, Total: 177 mg/dL (ref 100–199)
HDL: 36 mg/dL — ABNORMAL LOW (ref >39)
LDL Chol Calc (NIH): 72 mg/dL (ref 0–99)
Triglycerides: 442 mg/dL — ABNORMAL HIGH (ref 0–149)
VLDL Cholesterol Cal: 69 mg/dL — ABNORMAL HIGH (ref 5–40)

## 2021-11-05 LAB — VITAMIN D 25 HYDROXY (VIT D DEFICIENCY, FRACTURES): Vit D, 25-Hydroxy: 26.7 ng/mL — ABNORMAL LOW (ref 30.0–100.0)

## 2021-11-05 LAB — HEMOGLOBIN A1C
Est. average glucose Bld gHb Est-mCnc: 100 mg/dL
Hgb A1c MFr Bld: 5.1 % (ref 4.8–5.6)

## 2021-11-05 MED ORDER — FISH OIL 1000 MG PO CAPS: 2.0000 | ORAL_CAPSULE | Freq: Two times a day (BID) | ORAL | 12 refills | Status: DC

## 2021-11-05 NOTE — Addendum Note (Signed)
Addended by: Eleonore Chiquito on: 11/05/2021 04:02 PM   Modules accepted: Orders

## 2021-11-07 DIAGNOSIS — F411 Generalized anxiety disorder: Secondary | ICD-10-CM | POA: Diagnosis not present

## 2021-11-07 DIAGNOSIS — F331 Major depressive disorder, recurrent, moderate: Secondary | ICD-10-CM | POA: Diagnosis not present

## 2021-11-07 DIAGNOSIS — F429 Obsessive-compulsive disorder, unspecified: Secondary | ICD-10-CM | POA: Diagnosis not present

## 2021-11-11 DIAGNOSIS — F419 Anxiety disorder, unspecified: Secondary | ICD-10-CM | POA: Diagnosis not present

## 2021-11-11 DIAGNOSIS — F331 Major depressive disorder, recurrent, moderate: Secondary | ICD-10-CM | POA: Diagnosis not present

## 2021-11-11 DIAGNOSIS — F411 Generalized anxiety disorder: Secondary | ICD-10-CM | POA: Diagnosis not present

## 2021-11-11 DIAGNOSIS — F429 Obsessive-compulsive disorder, unspecified: Secondary | ICD-10-CM | POA: Diagnosis not present

## 2021-11-13 DIAGNOSIS — Z6836 Body mass index (BMI) 36.0-36.9, adult: Secondary | ICD-10-CM | POA: Diagnosis not present

## 2021-11-13 DIAGNOSIS — J329 Chronic sinusitis, unspecified: Secondary | ICD-10-CM | POA: Diagnosis not present

## 2021-11-13 DIAGNOSIS — J40 Bronchitis, not specified as acute or chronic: Secondary | ICD-10-CM | POA: Diagnosis not present

## 2021-11-14 DIAGNOSIS — F411 Generalized anxiety disorder: Secondary | ICD-10-CM | POA: Diagnosis not present

## 2021-11-14 DIAGNOSIS — F429 Obsessive-compulsive disorder, unspecified: Secondary | ICD-10-CM | POA: Diagnosis not present

## 2021-11-14 DIAGNOSIS — F331 Major depressive disorder, recurrent, moderate: Secondary | ICD-10-CM | POA: Diagnosis not present

## 2021-11-28 DIAGNOSIS — F429 Obsessive-compulsive disorder, unspecified: Secondary | ICD-10-CM | POA: Diagnosis not present

## 2021-11-28 DIAGNOSIS — F411 Generalized anxiety disorder: Secondary | ICD-10-CM | POA: Diagnosis not present

## 2021-11-28 DIAGNOSIS — F331 Major depressive disorder, recurrent, moderate: Secondary | ICD-10-CM | POA: Diagnosis not present

## 2021-12-08 DIAGNOSIS — F331 Major depressive disorder, recurrent, moderate: Secondary | ICD-10-CM | POA: Diagnosis not present

## 2021-12-08 DIAGNOSIS — F429 Obsessive-compulsive disorder, unspecified: Secondary | ICD-10-CM | POA: Diagnosis not present

## 2021-12-08 DIAGNOSIS — F419 Anxiety disorder, unspecified: Secondary | ICD-10-CM | POA: Diagnosis not present

## 2021-12-08 DIAGNOSIS — F411 Generalized anxiety disorder: Secondary | ICD-10-CM | POA: Diagnosis not present

## 2021-12-09 DIAGNOSIS — Z0289 Encounter for other administrative examinations: Secondary | ICD-10-CM

## 2021-12-12 DIAGNOSIS — F331 Major depressive disorder, recurrent, moderate: Secondary | ICD-10-CM | POA: Diagnosis not present

## 2021-12-12 DIAGNOSIS — F429 Obsessive-compulsive disorder, unspecified: Secondary | ICD-10-CM | POA: Diagnosis not present

## 2021-12-12 DIAGNOSIS — F411 Generalized anxiety disorder: Secondary | ICD-10-CM | POA: Diagnosis not present

## 2021-12-17 ENCOUNTER — Other Ambulatory Visit: Payer: Self-pay

## 2021-12-17 ENCOUNTER — Encounter (INDEPENDENT_AMBULATORY_CARE_PROVIDER_SITE_OTHER): Payer: Self-pay | Admitting: Family Medicine

## 2021-12-17 ENCOUNTER — Ambulatory Visit (INDEPENDENT_AMBULATORY_CARE_PROVIDER_SITE_OTHER): Payer: BC Managed Care – PPO | Admitting: Family Medicine

## 2021-12-17 VITALS — BP 120/79 | HR 69 | Temp 98.3°F | Ht 68.0 in | Wt 250.0 lb

## 2021-12-17 DIAGNOSIS — F39 Unspecified mood [affective] disorder: Secondary | ICD-10-CM

## 2021-12-17 DIAGNOSIS — I152 Hypertension secondary to endocrine disorders: Secondary | ICD-10-CM

## 2021-12-17 DIAGNOSIS — E119 Type 2 diabetes mellitus without complications: Secondary | ICD-10-CM | POA: Diagnosis not present

## 2021-12-17 DIAGNOSIS — Z6838 Body mass index (BMI) 38.0-38.9, adult: Secondary | ICD-10-CM

## 2021-12-17 DIAGNOSIS — E1159 Type 2 diabetes mellitus with other circulatory complications: Secondary | ICD-10-CM

## 2021-12-17 DIAGNOSIS — E1169 Type 2 diabetes mellitus with other specified complication: Secondary | ICD-10-CM | POA: Diagnosis not present

## 2021-12-17 DIAGNOSIS — E669 Obesity, unspecified: Secondary | ICD-10-CM

## 2021-12-17 DIAGNOSIS — E559 Vitamin D deficiency, unspecified: Secondary | ICD-10-CM

## 2021-12-17 DIAGNOSIS — E782 Mixed hyperlipidemia: Secondary | ICD-10-CM

## 2021-12-17 DIAGNOSIS — Z9189 Other specified personal risk factors, not elsewhere classified: Secondary | ICD-10-CM

## 2021-12-17 DIAGNOSIS — R0602 Shortness of breath: Secondary | ICD-10-CM

## 2021-12-17 DIAGNOSIS — Z7985 Long-term (current) use of injectable non-insulin antidiabetic drugs: Secondary | ICD-10-CM

## 2021-12-17 DIAGNOSIS — R5383 Other fatigue: Secondary | ICD-10-CM | POA: Diagnosis not present

## 2021-12-18 LAB — FOLATE: Folate: 16.7 ng/mL (ref >3.0)

## 2021-12-18 LAB — T4, FREE: Free T4: 1.41 ng/dL (ref 0.82–1.77)

## 2021-12-18 LAB — VITAMIN B12: Vitamin B-12: >2000 pg/mL — ABNORMAL HIGH (ref 232–1245)

## 2021-12-18 LAB — INSULIN, RANDOM: INSULIN: 52.5 u[IU]/mL — ABNORMAL HIGH (ref 2.6–24.9)

## 2021-12-18 NOTE — Progress Notes (Signed)
Chief Complaint:   Emily Weber (MR# [id number]) is a 36 y.o. female who presents for evaluation and treatment of Emily and related comorbidities. Current BMI is Body mass index is 38.01 kg/m. Emily Weber has been struggling with her weight for many years and has been unsuccessful in either losing weight, maintaining weight loss, or reaching her healthy weight goal.  Emily Weber is currently in the action stage of change and ready to dedicate time achieving and maintaining a healthier weight. Emily Weber is interested in becoming our patient and working on intensive lifestyle modifications including (but not limited to) diet and exercise for weight loss.  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  Her husband, Emily Weber, tried keto and Optavia in the past.  She eats out 3-4 times weekly.  Craves sweets/bread, cookies.  Skips breakfast.  Worst habit is fast food.  Recently had BMP, CBC, liver function, A1c, FLP, TSH, and vitamin D on 11/04/2021 with Cardiology.   Emily Weber's habits were reviewed today and are as follows: Her family eats meals together, she thinks her family will eat healthier with her, her desired weight loss is 69 pounds, she has been heavy most of her life, she started gaining weight after having children, her heaviest weight ever was 302 pounds, she craves bread, sweets, she snacks frequently in the evenings, she skips breakfast frequently, she is frequently drinking liquids with calories, she frequently makes poor food choices, she has problems with excessive hunger, she frequently eats larger portions than normal, and she struggles with emotional eating.  Depression Screen Emily Weber's Food and Mood (modified PHQ-9) score was 16.  Depression screen PHQ 2/9 12/17/2021  Decreased Interest 2  Down, Depressed, Hopeless 3  PHQ - 2 Score 5  Altered sleeping 2  Tired, decreased energy 3  Change in appetite 2  Feeling bad or failure about yourself  3  Trouble concentrating 1   Moving slowly or fidgety/restless 0  Suicidal thoughts 0  PHQ-9 Score 16  Difficult doing work/chores Not difficult at all   Subjective:   1. Other fatigue Emily Weber admits to daytime somnolence and reports waking up still tired. Patient has a history of symptoms of daytime fatigue, morning fatigue, morning headache, and snoring. Emily Weber generally gets 7 or 8 hours of sleep per night, and states that she has poor quality sleep. Snoring is present. Apneic episodes are not present. Epworth Sleepiness Score is 6.  History of "mild OSA".  Had sleep test 5 years ago or so.  No need for mask/CPAP.  2. Shortness of breath on exertion Emily Weber notes increasing shortness of breath with exercising and seems to be worsening over time with weight gain. She notes getting out of breath sooner with activity than she used to. This has gotten worse recently. Emily Weber denies shortness of breath at rest or orthopnea.  ECG:  No change from prior done on 06/11/2021.  No acute abnormalities, rate 75.  3. Diabetes mellitus type 2, diet-controlled (HCC) Diet controlled currently.  Last A1c was 5.1 on 11/04/2021.  She saw Endo in the past.  She did Optavia and lost 40 pounds and A1c corrected.  She was on metformin and insulin in the past.  4. Hypertension associated with diabetes (HCC) Currently diet controlled.    5. Mixed diabetic hyperlipidemia associated with type 2 diabetes mellitus (HCC) Emily Weber saw Dr. Tomie China with Cardiology recently, and he obtained labs.  She is not on any medications.  6. Vitamin  D deficiency She is currently taking OTC vitamin D 10,000 IU each day. She denies nausea, vomiting or muscle weakness.  7. Mood disorder (HCC) With depression, anxiety, and OCD.  PHQ is 16.  Denies SI.  She sees [employer] [facility] for counseling once per week for several weeks now.  She says it helps a lot.  She is taking fluoxetine and Xanax as needed.  8. At risk for impaired metabolic function Emily Weber is at increased  risk for impaired metabolic function due to taking Cytomel for abnormal cortisol levels, diabetes, and Emily.  Assessment/Plan:   Orders Placed This Encounter  Procedures   Folate   Vitamin B12   Insulin, random   T4, free   EKG 12-Lead    Medications Discontinued During This Encounter  Medication Reason   albuterol (VENTOLIN HFA) 108 (90 Base) MCG/ACT inhaler    Multiple Vitamin (MULTIVITAMIN) capsule    Omega-3 Fatty Acids (FISH OIL) 1000 MG CAPS    omeprazole (PRILOSEC) 20 MG capsule    OZEMPIC, 0.25 OR 0.5 MG/DOSE, 2 MG/1.5ML SOPN    PROGESTERONE PO    promethazine (PHENERGAN) 25 MG tablet      No orders of the defined types were placed in this encounter.    1. Other fatigue Emily Weber does feel that her weight is causing her energy to be lower than it should be. Fatigue may be related to Emily, depression or many other causes. Labs will be ordered, and in the meanwhile, Emily Weber will focus on self care including making healthy food choices, increasing physical activity and focusing on stress reduction.  - EKG 12-Lead - Folate - Vitamin B12 - T4, free  2. Shortness of breath on exertion Emily Weber does feel that she gets out of breath more easily that she used to when she exercises. Emily Weber's shortness of breath appears to be Emily related and exercise induced. She has agreed to work on weight loss and gradually increase exercise to treat her exercise induced shortness of breath. Will continue to monitor closely.  3. Diabetes mellitus type 2, diet-controlled (HCC) Good blood sugar control is important to decrease the likelihood of diabetic complications such as nephropathy, neuropathy, limb loss, blindness, coronary artery disease, and death. Intensive lifestyle modification including diet, exercise and weight loss are the first line of treatment for diabetes. Will check insulin level today.  - Insulin, random  4. Hypertension associated with diabetes (HCC) Emily Weber is  working on healthy weight loss and exercise to improve blood pressure control. We will watch for signs of hypotension as she continues her lifestyle modifications.  5. Mixed diabetic hyperlipidemia associated with type 2 diabetes mellitus (HCC) Cardiovascular risk and specific lipid/LDL goals reviewed.  We discussed several lifestyle modifications today and Navneet will continue to work on diet, exercise and weight loss efforts. Orders and follow up as documented in patient record.   Counseling Intensive lifestyle modifications are the first line treatment for this issue. Dietary changes: Increase soluble fiber. Decrease simple carbohydrates. Exercise changes: Moderate to vigorous-intensity aerobic activity 150 minutes per week if tolerated. Lipid-lowering medications: see documented in medical record.  6. Vitamin D deficiency Low Vitamin D level contributes to fatigue and are associated with Emily, breast, and colon cancer. She agrees to continue to take Vitamin D @10 ,000 IU daily and will follow-up for routine testing of Vitamin D, at least 2-3 times per year to avoid over-replacement.  7. Mood disorder (HCC) Behavior modification techniques were discussed today to help Emily Weber deal with her emotional/non-hunger eating  behaviors.  Orders and follow up as documented in patient record.   8. At risk for impaired metabolic function Due to Emily Weber's current state of health and medical condition(s), she is at a significantly higher risk for impaired metabolic function.   At least 23 minutes was spent on counseling Emily Weber about these concerns today.  This places the patient at a much greater risk to subsequently develop cardio-pulmonary conditions that can negatively affect the patient's quality of life.  I stressed the importance of reversing these risks factors.  The initial goal is to lose at least 5-10% of starting weight to help reduce risk factors.  Counseling:  Intensive lifestyle modifications  discussed with Emmery as the most appropriate first line treatment.  she will continue to work on diet, exercise, and weight loss efforts.  We will continue to reassess these conditions on a fairly regular basis in an attempt to decrease the patient's overall morbidity and mortality.   9. Emily with current BMI of 38.1  Emily Weber is currently in the action stage of change and her goal is to continue with weight loss efforts. I recommend Emily Weber begin the structured treatment plan as follows:  She has agreed to the Category 2 Plan.  Exercise goals:  As is.    Behavioral modification strategies: decreasing simple carbohydrates, no skipping meals, and planning for success.  She was informed of the importance of frequent follow-up visits to maximize her success with intensive lifestyle modifications for her multiple health conditions. She was informed we would discuss her lab results at her next visit unless there is a critical issue that needs to be addressed sooner. Roselia agreed to keep her next visit at the agreed upon time to discuss these results.  Objective:   Blood pressure 120/79, pulse 69, temperature 98.3 F (36.8 C), temperature source Oral, height 5\' 8"  (1.727 m), weight 250 lb (113.4 kg), last menstrual period 10/12/2021, SpO2 99 %, unknown if currently breastfeeding. Body mass index is 38.01 kg/m.  EKG: Normal sinus rhythm, rate 75 bpm.  Indirect Calorimeter completed today shows a VO2 of 318 and a REE of 2189.  Her calculated basal metabolic rate is 1610 thus her basal metabolic rate is better than expected.  General: Cooperative, alert, well developed, in no acute distress. HEENT: Conjunctivae and lids unremarkable. Cardiovascular: Regular rhythm.  Lungs: Normal work of breathing. Neurologic: No focal deficits.   Lab Results  Component Value Date   CREATININE 0.55 (L) 11/04/2021   BUN 12 11/04/2021   NA 139 11/04/2021   K 4.7 11/04/2021   CL 102 11/04/2021   CO2 25  11/04/2021   Lab Results  Component Value Date   ALT 18 11/04/2021   AST 18 11/04/2021   ALKPHOS 52 11/04/2021   BILITOT <0.2 11/04/2021   Lab Results  Component Value Date   HGBA1C 5.1 11/04/2021   Lab Results  Component Value Date   INSULIN 52.5 (H) 12/17/2021   Lab Results  Component Value Date   TSH 0.785 11/04/2021   Lab Results  Component Value Date   CHOL 177 11/04/2021   HDL 36 (L) 11/04/2021   LDLCALC 72 11/04/2021   TRIG 442 (H) 11/04/2021   CHOLHDL 4.9 (H) 11/04/2021   Lab Results  Component Value Date   WBC 7.5 11/04/2021   HGB 14.0 11/04/2021   HCT 40.1 11/04/2021   MCV 92 11/04/2021   PLT 243 11/04/2021   Attestation Statements:   Reviewed by clinician on day of visit: allergies,  medications, problem list, medical history, surgical history, family history, social history, and previous encounter notes.  I, Insurance claims handler, CMA, am acting as Energy manager for [employer], DO.  I have reviewed the above documentation for accuracy and completeness, and I agree with the above. Carlye Grippe, D.O.  The 21st Century Cures Act was signed into law in 2016 which includes the topic of electronic health records.  This provides immediate access to information in MyChart.  This includes consultation notes, operative notes, office notes, lab results and pathology reports.  If you have any questions about what you read please let us know at your next visit so we can discuss your concerns and take corrective action if need be.  We are right here with you.

## 2021-12-19 DIAGNOSIS — F429 Obsessive-compulsive disorder, unspecified: Secondary | ICD-10-CM | POA: Diagnosis not present

## 2021-12-19 DIAGNOSIS — F411 Generalized anxiety disorder: Secondary | ICD-10-CM | POA: Diagnosis not present

## 2021-12-19 DIAGNOSIS — F331 Major depressive disorder, recurrent, moderate: Secondary | ICD-10-CM | POA: Diagnosis not present

## 2021-12-26 DIAGNOSIS — F331 Major depressive disorder, recurrent, moderate: Secondary | ICD-10-CM | POA: Diagnosis not present

## 2021-12-26 DIAGNOSIS — F411 Generalized anxiety disorder: Secondary | ICD-10-CM | POA: Diagnosis not present

## 2021-12-26 DIAGNOSIS — F429 Obsessive-compulsive disorder, unspecified: Secondary | ICD-10-CM | POA: Diagnosis not present

## 2021-12-29 DIAGNOSIS — E282 Polycystic ovarian syndrome: Secondary | ICD-10-CM | POA: Diagnosis not present

## 2021-12-29 DIAGNOSIS — I10 Essential (primary) hypertension: Secondary | ICD-10-CM | POA: Diagnosis not present

## 2021-12-29 DIAGNOSIS — E669 Obesity, unspecified: Secondary | ICD-10-CM | POA: Diagnosis not present

## 2021-12-29 DIAGNOSIS — E1169 Type 2 diabetes mellitus with other specified complication: Secondary | ICD-10-CM | POA: Diagnosis not present

## 2021-12-29 DIAGNOSIS — Z7985 Long-term (current) use of injectable non-insulin antidiabetic drugs: Secondary | ICD-10-CM | POA: Diagnosis not present

## 2022-01-01 ENCOUNTER — Ambulatory Visit (INDEPENDENT_AMBULATORY_CARE_PROVIDER_SITE_OTHER): Payer: BC Managed Care – PPO | Admitting: Family Medicine

## 2022-01-01 ENCOUNTER — Other Ambulatory Visit: Payer: Self-pay

## 2022-01-01 ENCOUNTER — Encounter (INDEPENDENT_AMBULATORY_CARE_PROVIDER_SITE_OTHER): Payer: Self-pay | Admitting: Family Medicine

## 2022-01-01 VITALS — BP 123/86 | HR 70 | Temp 98.1°F | Ht 68.0 in | Wt 247.0 lb

## 2022-01-01 DIAGNOSIS — E1169 Type 2 diabetes mellitus with other specified complication: Secondary | ICD-10-CM

## 2022-01-01 DIAGNOSIS — Z6837 Body mass index (BMI) 37.0-37.9, adult: Secondary | ICD-10-CM

## 2022-01-01 DIAGNOSIS — Z9189 Other specified personal risk factors, not elsewhere classified: Secondary | ICD-10-CM

## 2022-01-01 DIAGNOSIS — I152 Hypertension secondary to endocrine disorders: Secondary | ICD-10-CM | POA: Diagnosis not present

## 2022-01-01 DIAGNOSIS — E669 Obesity, unspecified: Secondary | ICD-10-CM

## 2022-01-01 DIAGNOSIS — E538 Deficiency of other specified B group vitamins: Secondary | ICD-10-CM

## 2022-01-01 DIAGNOSIS — Z7985 Long-term (current) use of injectable non-insulin antidiabetic drugs: Secondary | ICD-10-CM

## 2022-01-01 DIAGNOSIS — E559 Vitamin D deficiency, unspecified: Secondary | ICD-10-CM | POA: Diagnosis not present

## 2022-01-01 DIAGNOSIS — E782 Mixed hyperlipidemia: Secondary | ICD-10-CM

## 2022-01-01 DIAGNOSIS — E1159 Type 2 diabetes mellitus with other circulatory complications: Secondary | ICD-10-CM | POA: Diagnosis not present

## 2022-01-06 DIAGNOSIS — F429 Obsessive-compulsive disorder, unspecified: Secondary | ICD-10-CM | POA: Diagnosis not present

## 2022-01-06 DIAGNOSIS — F411 Generalized anxiety disorder: Secondary | ICD-10-CM | POA: Diagnosis not present

## 2022-01-06 DIAGNOSIS — F331 Major depressive disorder, recurrent, moderate: Secondary | ICD-10-CM | POA: Diagnosis not present

## 2022-01-06 DIAGNOSIS — F419 Anxiety disorder, unspecified: Secondary | ICD-10-CM | POA: Diagnosis not present

## 2022-01-09 DIAGNOSIS — F411 Generalized anxiety disorder: Secondary | ICD-10-CM | POA: Diagnosis not present

## 2022-01-09 DIAGNOSIS — F331 Major depressive disorder, recurrent, moderate: Secondary | ICD-10-CM | POA: Diagnosis not present

## 2022-01-09 DIAGNOSIS — F429 Obsessive-compulsive disorder, unspecified: Secondary | ICD-10-CM | POA: Diagnosis not present

## 2022-01-09 NOTE — Progress Notes (Signed)
? ? ? ?Chief Complaint:  ? ?OBESITY ?Emily Weber is here to discuss her progress with her obesity treatment plan along with follow-up of her obesity related diagnoses. Emily Weber is on the Category 2 Plan and states she is following her eating plan approximately 90% of the time. Emily Weber states she is not exercising regularly at this time. ? ?Today's visit was #: 2 ?Starting weight: 250 lbs ?Starting date: 12/17/2021 ?Today's weight: 247 lbs ?Today's date: 01/01/2022 ?Total lbs lost to date: 3 lbs ?Total lbs lost since last in-office visit: 3 lbs ? ?Interim History: Emily Weber is here today for her first follow-up office visit since starting the program with [place].  All blood work/ lab tests that were recently ordered by myself or an outside provider were reviewed with patient today per their request.   Extended time was spent counseling her on all new disease processes that were discovered or preexisting ones that are affected by BMI.  she understands that many of these abnormalities will need to monitored regularly along with the current treatment plan of prudent dietary changes, in which we are making each and every office visit, to improve these health parameters.  Labs from Dr. Joneen Caraway on on 11/04/2021, reviewed with patient today, including vitamin D, FLP CMP, CBC, A1c , in addition to the labs we drew on 12/17/2021. ? ?Emily Weber say she had a few "bad days" where she could not resist mac & cheese, cake, etc.  She denies hunger or cravings. ? ?Subjective:  ? ?1. Type 2 diabetes mellitus with other specified complication, without long-term current use of insulin (Emily Weber) ?Discussed labs with patient today.  FI and A1c 5.1. ?Dr. Tamala Julian with Endo is giving the patient Trulicity at low dose 4.16 for 4 weeks and just increased to 1.5 mg today. ? ?2. Hypertension associated with type 2 diabetes mellitus (Emily Weber) ?Discussed labs with patient today.  ?Asymptomatic.  Denies concerns. Treated by Cardiology.  Diet controlled. ? ?3. Vitamin D  deficiency ?Worsening. Discussed labs with patient today.  ?Started taking vitamin D and potassium 10,000 IU daily OTC. ?She started it one month ago after recent labs. ? ?4. B12 deficiency ?Worsening.  Discussed labs with patient today. ?Patient stopped B12 supplement for 2-3 months, but levels are still elevated. ? ?5. Mixed diabetic hyperlipidemia associated with type 2 diabetes mellitus ([name]) ?Worsening. Discussed labs with patient today. ?Labs obtained by Cardiology 11/04/2021 and reviewed with patient.  Per patient, cardiologist told her to take fish oil but she is not sure how much to take.  ? ?6. At risk for deficient knowledge of diabetes mellitus ?Emily Weber is at risk for deficient knowledge of diabetes. ? ?Assessment/Plan:  ?No orders of the defined types were placed in this encounter. ? ? ?There are no discontinued medications.  ? ?No orders of the defined types were placed in this encounter. ?  ? ?1. Type 2 diabetes mellitus with other specified complication, without long-term current use of insulin ([name]) ?Extensive education provided and handouts given to patient regarding IR and how foods affect her blood sugars as well. ? ?2. Hypertension associated with type 2 diabetes mellitus ([name]) ?BP at goal essentially.  Cut down salt intake and lose with via prudent nutritional plan. ? ?3. Vitamin D deficiency ?Continue 10,000 IU vitamin D with potassium daily.  No need for prescription. ?- I discussed the importance of vitamin D to the patient's health and well-being.  ?- I reviewed possible symptoms of low Vitamin D:  low energy, depressed mood,  muscle aches, joint aches, osteoporosis etc. was reviewed with patient ?- low Vitamin D levels may be linked to an increased risk of cardiovascular events and even increased risk of cancers- such as colon and breast.  ?- ideal vitamin D levels reviewed with patient  ?- Informed patient this may be a lifelong thing, and she was encouraged to continue to take the medicine  until told otherwise.    ?- weight loss will likely improve availability of vitamin D, thus encouraged Emily Weber to continue with meal plan and their weight loss efforts to further improve this condition.  Thus, we will need to monitor levels regularly (every 3-4 mo on average) to keep levels within normal limits and prevent over supplementation. ?- pt's questions and concerns regarding this condition addressed. ? ?4. B12 deficiency ?Stop all B12 sources as B12 is above goal.  Educated patient that drinks and various supplements and have B12 and she admitted to drinking "energy drinks". ? ?5. Mixed diabetic hyperlipidemia associated with type 2 diabetes mellitus ([name]) ?Start fish oil 3-4 grams/day for her elevated triglycerides and low HDL.  Extensive education provided. ? ?6. At risk for deficient knowledge of diabetes mellitus ?Emily Weber was given approximately 23 minutes of diabetes education and counseling today. We discussed intensive lifestyle modifications today with an emphasis on weight loss as well as increasing exercise and decreasing simple carbohydrates in her diet. We also reviewed medication with an emphasis on risk versus benefit of those discussed.  ? ?7. Obesity with curren BMI of 37.6 ? ?Emily Weber is currently in the action stage of change. As such, her goal is to continue with weight loss efforts. She has agreed to the Category 2 Plan.  ? ?Exercise goals:  As is. ? ?Behavioral modification strategies: increasing lean protein intake, decreasing simple carbohydrates, meal planning and cooking strategies, and planning for success. ? ?Emily Weber has agreed to follow-up with our clinic in 2 weeks. She was informed of the importance of frequent follow-up visits to maximize her success with intensive lifestyle modifications for her multiple health conditions.  ? ?Objective:  ? ?Blood pressure 123/86, pulse 70, temperature 98.1 ?F (36.7 ?C), height _0  (1.727 m), weight 247 lb (112 kg), SpO2 98 %, unknown if  currently breastfeeding. ?Body mass index is 37.56 kg/m?. ? ?General: Cooperative, alert, well developed, in no acute distress. ?HEENT: Conjunctivae and lids unremarkable. ?Cardiovascular: Regular rhythm.  ?Lungs: Normal work of breathing. ?Neurologic: No focal deficits.  ? ?Lab Results  ?Component Value Date  ? CREATININE 0.55 (L) 11/04/2021  ? BUN 12 11/04/2021  ? NA 139 11/04/2021  ? K 4.7 11/04/2021  ? CL 102 11/04/2021  ? CO2 25 11/04/2021  ? ?Lab Results  ?Component Value Date  ? ALT 18 11/04/2021  ? AST 18 11/04/2021  ? ALKPHOS 52 11/04/2021  ? BILITOT <0.2 11/04/2021  ? ?Lab Results  ?Component Value Date  ? HGBA1C 5.1 11/04/2021  ? ?Lab Results  ?Component Value Date  ? INSULIN 52.5 (H) 12/17/2021  ? ?Lab Results  ?Component Value Date  ? TSH 0.785 11/04/2021  ? ?Lab Results  ?Component Value Date  ? CHOL 177 11/04/2021  ? HDL 36 (L) 11/04/2021  ? [place] 72 11/04/2021  ? TRIG 442 (H) 11/04/2021  ? CHOLHDL 4.9 (H) 11/04/2021  ? ?Lab Results  ?Component Value Date  ? VD25OH 26.7 (L) 11/04/2021  ? ?Lab Results  ?Component Value Date  ? WBC 7.5 11/04/2021  ? HGB 14.0 11/04/2021  ? HCT 40.1 11/04/2021  ?  MCV 92 11/04/2021  ? PLT 243 11/04/2021  ? ?Attestation Statements:  ? ?Reviewed by clinician on day of visit: allergies, medications, problem list, medical history, surgical history, family history, social history, and previous encounter notes. ? ?I, Water quality scientist, CMA, am acting as transcriptionist for [employer], DO. ? ?I have reviewed the above documentation for accuracy and completeness, and I agree with the above. Marjory Sneddon, D.O. ? ?The [place] was signed into law in 2016 which includes the topic of electronic health records.  This provides immediate access to information in MyChart.  This includes consultation notes, operative notes, office notes, lab results and pathology reports.  If you have any questions about what you read please let us know at your next visit so we can  discuss your concerns and take corrective action if need be.  We are right here with you. ? ?

## 2022-01-16 DIAGNOSIS — F411 Generalized anxiety disorder: Secondary | ICD-10-CM | POA: Diagnosis not present

## 2022-01-16 DIAGNOSIS — F331 Major depressive disorder, recurrent, moderate: Secondary | ICD-10-CM | POA: Diagnosis not present

## 2022-01-16 DIAGNOSIS — F429 Obsessive-compulsive disorder, unspecified: Secondary | ICD-10-CM | POA: Diagnosis not present

## 2022-01-22 ENCOUNTER — Encounter (INDEPENDENT_AMBULATORY_CARE_PROVIDER_SITE_OTHER): Payer: Self-pay | Admitting: Family Medicine

## 2022-01-22 ENCOUNTER — Ambulatory Visit (INDEPENDENT_AMBULATORY_CARE_PROVIDER_SITE_OTHER): Payer: BC Managed Care – PPO | Admitting: Family Medicine

## 2022-01-22 VITALS — BP 121/77 | HR 68 | Temp 98.1°F | Ht 68.0 in | Wt 248.0 lb

## 2022-01-22 DIAGNOSIS — E538 Deficiency of other specified B group vitamins: Secondary | ICD-10-CM

## 2022-01-22 DIAGNOSIS — E785 Hyperlipidemia, unspecified: Secondary | ICD-10-CM | POA: Diagnosis not present

## 2022-01-22 DIAGNOSIS — Z9189 Other specified personal risk factors, not elsewhere classified: Secondary | ICD-10-CM

## 2022-01-22 DIAGNOSIS — E1169 Type 2 diabetes mellitus with other specified complication: Secondary | ICD-10-CM

## 2022-01-22 DIAGNOSIS — E669 Obesity, unspecified: Secondary | ICD-10-CM | POA: Diagnosis not present

## 2022-01-22 DIAGNOSIS — Z6837 Body mass index (BMI) 37.0-37.9, adult: Secondary | ICD-10-CM

## 2022-01-22 HISTORY — DX: Type 2 diabetes mellitus with other specified complication: E11.69

## 2022-01-23 DIAGNOSIS — F411 Generalized anxiety disorder: Secondary | ICD-10-CM | POA: Diagnosis not present

## 2022-01-23 DIAGNOSIS — F331 Major depressive disorder, recurrent, moderate: Secondary | ICD-10-CM | POA: Diagnosis not present

## 2022-01-23 DIAGNOSIS — F429 Obsessive-compulsive disorder, unspecified: Secondary | ICD-10-CM | POA: Diagnosis not present

## 2022-02-02 DIAGNOSIS — F411 Generalized anxiety disorder: Secondary | ICD-10-CM | POA: Diagnosis not present

## 2022-02-02 DIAGNOSIS — F419 Anxiety disorder, unspecified: Secondary | ICD-10-CM | POA: Diagnosis not present

## 2022-02-02 DIAGNOSIS — F331 Major depressive disorder, recurrent, moderate: Secondary | ICD-10-CM | POA: Diagnosis not present

## 2022-02-02 DIAGNOSIS — F429 Obsessive-compulsive disorder, unspecified: Secondary | ICD-10-CM | POA: Diagnosis not present

## 2022-02-03 NOTE — Progress Notes (Signed)
? ? ? ?Chief Complaint:  ? ?OBESITY ?Emily Weber is here to discuss her progress with her obesity treatment plan along with follow-up of her obesity related diagnoses. Stuart is on the Category 2 Plan and states she is following her eating plan approximately 85% of the time. Annalissa states she is not currently exercising. ? ?Today's visit was #: 3 ?Starting weight: 250 lbs ?Starting date: 12/17/2021 ?Today's weight: 248 lbs ?Today's date: 01/22/2022 ?Total lbs lost to date: 2 ?Total lbs lost since last in-office visit: 0 ? ?Interim History: [name] states at [place] she splurged. She still has several birthday parties and social gatherings where she is eating off the plan, and engaging in celebration eating. She endorses that she is loving cake.  ? ?Subjective:  ? ?1. Type 2 diabetes mellitus with other specified complication, without long-term current use of insulin (HCC) ?[name]'s A1c was last checked at 5.1. She sees Dr. Katrinka Blazing of Endocrinology.  ? ?2. 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 She is tolerating it well with no issues.  ? ?3. Vitamin B12 deficiency ?[name] was over supplementing, and was told to stop B12 exogenous sources at her last office visit.  ? ?4. At risk for malnutrition ?[name] is at increased risk for malnutrition due to poor diet.  ? ?Assessment/Plan:  ?No orders of the defined types were placed in this encounter. ? ? ?There are no discontinued medications.  ? ?No orders of the defined types were placed in this encounter. ?  ? ?1. Type 2 diabetes mellitus with other specified complication, without long-term current use of insulin (HCC) ?Gabbie is on Trulicity per integrative medicine doctor, and also has Dr. Katrinka Blazing of Endocrinology who told the patient that he will entertain [facility] in the future if needed. She will continue her prudent nutritional plan and weight loss.  ? ?2. Hyperlipidemia associated with type 2 diabetes  mellitus (HCC) mixed ?Demiana will continue her cholesterol medications per her PCP and specialists. She will continue her prudent nutritional plan as we discussed. Eventually increase her activity.  ? ?3. Vitamin B12 deficiency ?[name] will continue to only get B12 via food, not energy drinks or other supplements.  ? ?4. At risk for malnutrition ?[name] was given approximately 9 minutes of counseling today regarding prevention of malnutrition and ways to meet macronutrient goals..  ? ?5. Obesity with curren BMI of 37.7 ?[name] is currently in the action stage of change. As such, her goal is to continue with weight loss efforts. She has agreed to the Category 2 Plan.  ? ?Discussed celebration eating strategies today.  ? ?Exercise goals: All adults should avoid inactivity. Some physical activity is better than none, and adults who participate in any amount of physical activity gain some health benefits. Walk outside. ? ?Behavioral modification strategies: decreasing simple carbohydrates and celebration eating strategies. ? ?[name] has agreed to follow-up with our clinic in 2 to 3 weeks. She was informed of the importance of frequent follow-up visits to maximize her success with intensive lifestyle modifications for her multiple health conditions.  ? ?Objective:  ? ?Blood pressure 121/77, pulse 68, temperature 98.1 ?F (36.7 ?C), height 5\' 8"  (1.727 m), weight 248 lb (112.5 kg), SpO2 98 %, unknown if currently breastfeeding. ?Body mass index is 37.71 kg/m?. ? ?General: Cooperative, alert, well developed, in no acute distress. ?HEENT: Conjunctivae and lids unremarkable. ?Cardiovascular: Regular rhythm.  ?Lungs: Normal work of breathing. ?Neurologic: No focal deficits.  ? ?  Lab Results  ?Component Value Date  ? CREATININE 0.55 (L) 11/04/2021  ? BUN 12 11/04/2021  ? NA 139 11/04/2021  ? K 4.7 11/04/2021  ? CL 102 11/04/2021  ? CO2 25 11/04/2021  ? ?Lab Results  ?Component Value Date  ? ALT 18 11/04/2021  ? AST 18 11/04/2021   ? ALKPHOS 52 11/04/2021  ? BILITOT <0.2 11/04/2021  ? ?Lab Results  ?Component Value Date  ? HGBA1C 5.1 11/04/2021  ? ?Lab Results  ?Component Value Date  ? INSULIN 52.5 (H) 12/17/2021  ? ?Lab Results  ?Component Value Date  ? TSH 0.785 11/04/2021  ? ?Lab Results  ?Component Value Date  ? CHOL 177 11/04/2021  ? HDL 36 (L) 11/04/2021  ? LDLCALC 72 11/04/2021  ? TRIG 442 (H) 11/04/2021  ? CHOLHDL 4.9 (H) 11/04/2021  ? ?Lab Results  ?Component Value Date  ? VD25OH 26.7 (L) 11/04/2021  ? ?Lab Results  ?Component Value Date  ? WBC 7.5 11/04/2021  ? HGB 14.0 11/04/2021  ? HCT 40.1 11/04/2021  ? MCV 92 11/04/2021  ? PLT 243 11/04/2021  ? ?No results found for: IRON, TIBC, FERRITIN ? ?Attestation Statements:  ? ?Reviewed by clinician on day of visit: allergies, medications, problem list, medical history, surgical history, family history, social history, and previous encounter notes. ? ? ?I, Burt Knack, am acting as transcriptionist for [employer], DO. ? ?I have reviewed the above documentation for accuracy and completeness, and I agree with the above. Carlye Grippe, D.O. ? ?The 21st Century Cures Act was signed into law in 2016 which includes the topic of electronic health records.  This provides immediate access to information in MyChart.  This includes consultation notes, operative notes, office notes, lab results and pathology reports.  If you have any questions about what you read please let us know at your next visit so we can discuss your concerns and take corrective action if need be.  We are right here with you. ? ? ?

## 2022-02-04 ENCOUNTER — Encounter (INDEPENDENT_AMBULATORY_CARE_PROVIDER_SITE_OTHER): Payer: Self-pay | Admitting: Family Medicine

## 2022-02-04 ENCOUNTER — Ambulatory Visit (INDEPENDENT_AMBULATORY_CARE_PROVIDER_SITE_OTHER): Payer: BC Managed Care – PPO | Admitting: Family Medicine

## 2022-02-04 VITALS — BP 120/79 | HR 71 | Temp 98.0°F | Ht 68.0 in | Wt 244.0 lb

## 2022-02-04 DIAGNOSIS — E669 Obesity, unspecified: Secondary | ICD-10-CM

## 2022-02-04 DIAGNOSIS — Z7985 Long-term (current) use of injectable non-insulin antidiabetic drugs: Secondary | ICD-10-CM

## 2022-02-04 DIAGNOSIS — E1169 Type 2 diabetes mellitus with other specified complication: Secondary | ICD-10-CM | POA: Diagnosis not present

## 2022-02-04 DIAGNOSIS — Z6837 Body mass index (BMI) 37.0-37.9, adult: Secondary | ICD-10-CM | POA: Diagnosis not present

## 2022-02-09 ENCOUNTER — Encounter: Payer: Self-pay | Admitting: Podiatry

## 2022-02-09 ENCOUNTER — Ambulatory Visit (INDEPENDENT_AMBULATORY_CARE_PROVIDER_SITE_OTHER): Payer: BC Managed Care – PPO | Admitting: Podiatry

## 2022-02-09 ENCOUNTER — Other Ambulatory Visit: Payer: Self-pay | Admitting: *Deleted

## 2022-02-09 DIAGNOSIS — L603 Nail dystrophy: Secondary | ICD-10-CM | POA: Diagnosis not present

## 2022-02-09 NOTE — Progress Notes (Signed)
?  Subjective:  ?Patient ID: Emily Weber, female    DOB: [birth year],   MRN: [id number] ? ?Chief Complaint  ?Patient presents with  ? Nail Problem  ?  I had a pedicure last month and the right big toenail has come off not all the way and it came off last week and I did not injury it and there is not any swelling and no redness and no draining   ? ? ?36 y.o. female presents for concern of right great toenail that fell off last week. Denies any injury bleeding or pain. Relates she is diabetic but controlled by diet. Relates they only thing happened recently was had a pedicure about a month ago.  . Denies any other pedal complaints. Denies n/v/f/c.  ? ?Past Medical History:  ?Diagnosis Date  ? [redacted] weeks gestation of pregnancy   ? Abnormal MSAFP (maternal serum alpha-fetoprotein), elevated 01/14/2018  ? 4.06 MoM  ? Anxiety   ? Back pain   ? Blood glucose abnormal 07/22/2016  ? Cardiac murmur 06/10/2021  ? Chest pain of uncertain etiology 06/10/2021  ? Cigarette smoker 06/10/2021  ? Depression   ? Diabetes mellitus (HCC) 07/01/2017  ? Diabetes mellitus type 2 in obese (HCC) 08/03/2017  ? HgbA1C: 5.7 at NOB  ? Dyslipidemia   ? Elevated AFP 12/26/2017  ? Spina Bifida risk 1:10 Referred to MFM for level II u/s and counseling.  ? Gallbladder problem   ? GERD (gastroesophageal reflux disease)   ? History of cesarean section 10/27/2017  ? Desires TOL, op note requested  ? Hypertension 07/01/2017  ? Hypertension, benign   ? Insulin resistance 07/26/2017  ? Irregular periods 08/21/2016  ? Mild obstructive sleep apnea   ? Morbid obesity (HCC) 06/10/2021  ? Morbid obesity with BMI of 40.0-44.9, adult (HCC) 08/21/2016  ? Nonsustained ventricular tachycardia (HCC) 06/10/2021  ? Palpitations   ? Panic attack   ? PCOS (polycystic ovarian syndrome) 07/26/2017  ? Post-cholecystectomy syndrome   ? chronic loose stools  ? Preeclampsia, third trimester 04/06/2014  ? Pregnancy induced hypertension   ? Pyelonephritis   ? Reflux gastritis   ?  Renal calculi 07/01/2017  ? Smoker 08/21/2016  ? Vitamin D deficiency   ? ? ?Objective:  ?Physical Exam: ?Vascular: DP/PT pulses 2/4 bilateral. CFT <3 seconds. Normal hair growth on digits. No edema.  ?Skin. No lacerations or abrasions bilateral feet. Hallux nail short and thickened but overall appears healthy.  ?Musculoskeletal: MMT 5/5 bilateral lower extremities in DF, PF, Inversion and Eversion. Deceased ROM in DF of ankle joint.  ?Neurological: Sensation intact to light touch.  ? ?Assessment:  ? ?1. Onychodystrophy   ? ? ? ?Plan:  ?Patient was evaluated and treated and all questions answered. ?-Examined patient ?-Discussed treatment options for painful dystrophic nails  ?-New nail appears to be growing in well and health. Advised it may take 6+ more months for the nail to fully grow in.  ?-Patient to return as needed.  ? ? ?Louann Sjogren, DPM  ? ? ?

## 2022-02-20 DIAGNOSIS — F411 Generalized anxiety disorder: Secondary | ICD-10-CM | POA: Diagnosis not present

## 2022-02-20 DIAGNOSIS — F331 Major depressive disorder, recurrent, moderate: Secondary | ICD-10-CM | POA: Diagnosis not present

## 2022-02-20 DIAGNOSIS — F429 Obsessive-compulsive disorder, unspecified: Secondary | ICD-10-CM | POA: Diagnosis not present

## 2022-02-23 NOTE — Progress Notes (Signed)
? ? ? ?Chief Complaint:  ? ?OBESITY ?Emily Weber is here to discuss her progress with her obesity treatment plan along with follow-up of her obesity related diagnoses. Emily Weber is on the Category 2 Plan and states she is following her eating plan approximately 90% of the time. Emily Weber states she is not currently exercising. ? ?Today's visit was #: 4 ?Starting weight: 250 lbs ?Starting date: 12/17/2021 ?Today's weight: 244 lbs ?Today's date: 02/04/2022 ?Total lbs lost to date: 6 ?Total lbs lost since last in-office visit: 4 ? ?Interim History: Emily Weber is here for a follow up office visit.  We reviewed her meal plan and all questions were answered.  Patient's food recall appears to be accurate and consistent with what is on plan when she is following it. When eating on plan, her hunger and cravings are well controlled. Patient loss 3+ lbs in fat mass and stayed the same in muscle mass. Doing great.  ? ?Subjective:  ? ?1. Type 2 diabetes mellitus with other specified complication, without long-term current use of insulin (HCC) ?PCP discontinued Trulicity due to stomach pain, and changed to [facility], which she will start tomorrow.  ? ?Assessment/Plan:  ?No orders of the defined types were placed in this encounter. ? ? ?Medications Discontinued During This Encounter  ?Medication Reason  ? Dulaglutide (TRULICITY) 1.5 MG/0.5ML SOPN Change in therapy  ?  ? ?No orders of the defined types were placed in this encounter. ?  ? ?1. Type 2 diabetes mellitus with other specified complication, without long-term current use of insulin (HCC) ?Continue medications per PCP. Education provider regarding Greggory Keen and how it can be helpful with patients weight loss journey. Continue PNP and weight loss.  ? ?2. Obesity, current BMI 37.1 ?Emily Weber is currently in the action stage of change. As such, her goal is to continue with weight loss efforts. She has agreed to the Category 2 Plan.  ? ?Increase water intake to approximately 1/2 her weight  in water per day. Focus on quality protein (ie, not hotdogs and 80-20 hamburger etc). ? ?Exercise goals: As is. ? ?Behavioral modification strategies: increasing lean protein intake. ? ?Teren has agreed to follow-up with our clinic in 2 to 3 weeks. She was informed of the importance of frequent follow-up visits to maximize her success with intensive lifestyle modifications for her multiple health conditions.  ? ?Objective:  ? ?Blood pressure 120/79, pulse 71, temperature 98 ?F (36.7 ?C), height 5\' 8"  (1.727 m), weight 244 lb (110.7 kg), SpO2 96 %, unknown if currently breastfeeding. ?Body mass index is 37.1 kg/m?. ? ?General: Cooperative, alert, well developed, in no acute distress. ?HEENT: Conjunctivae and lids unremarkable. ?Cardiovascular: Regular rhythm.  ?Lungs: Normal work of breathing. ?Neurologic: No focal deficits.  ? ?Lab Results  ?Component Value Date  ? CREATININE 0.55 (L) 11/04/2021  ? BUN 12 11/04/2021  ? NA 139 11/04/2021  ? K 4.7 11/04/2021  ? CL 102 11/04/2021  ? CO2 25 11/04/2021  ? ?Lab Results  ?Component Value Date  ? ALT 18 11/04/2021  ? AST 18 11/04/2021  ? ALKPHOS 52 11/04/2021  ? BILITOT <0.2 11/04/2021  ? ?Lab Results  ?Component Value Date  ? HGBA1C 5.1 11/04/2021  ? ?Lab Results  ?Component Value Date  ? INSULIN 52.5 (H) 12/17/2021  ? ?Lab Results  ?Component Value Date  ? TSH 0.785 11/04/2021  ? ?Lab Results  ?Component Value Date  ? CHOL 177 11/04/2021  ? HDL 36 (L) 11/04/2021  ? LDLCALC 72 11/04/2021  ?  TRIG 442 (H) 11/04/2021  ? CHOLHDL 4.9 (H) 11/04/2021  ? ?Lab Results  ?Component Value Date  ? VD25OH 26.7 (L) 11/04/2021  ? ?Lab Results  ?Component Value Date  ? WBC 7.5 11/04/2021  ? HGB 14.0 11/04/2021  ? HCT 40.1 11/04/2021  ? MCV 92 11/04/2021  ? PLT 243 11/04/2021  ? ?No results found for: IRON, TIBC, FERRITIN ? ?Attestation Statements:  ? ?Reviewed by clinician on day of visit: allergies, medications, problem list, medical history, surgical history, family history, social  history, and previous encounter notes. ? ?Time spent on visit including pre-visit chart review and post-visit care and charting was 22 minutes.  ? ? ?I, Burt Knack, am acting as transcriptionist for [employer], DO. ? ?I have reviewed the above documentation for accuracy and completeness, and I agree with the above. Carlye Grippe, D.O. ? ?The 21st Century Cures Act was signed into law in 2016 which includes the topic of electronic health records.  This provides immediate access to information in MyChart.  This includes consultation notes, operative notes, office notes, lab results and pathology reports.  If you have any questions about what you read please let us know at your next visit so we can discuss your concerns and take corrective action if need be.  We are right here with you. ? ? ?

## 2022-02-25 ENCOUNTER — Ambulatory Visit (INDEPENDENT_AMBULATORY_CARE_PROVIDER_SITE_OTHER): Payer: BC Managed Care – PPO | Admitting: Family Medicine

## 2022-02-25 ENCOUNTER — Encounter (INDEPENDENT_AMBULATORY_CARE_PROVIDER_SITE_OTHER): Payer: Self-pay | Admitting: Family Medicine

## 2022-02-25 VITALS — BP 108/72 | HR 73 | Temp 98.6°F | Ht 68.0 in | Wt 243.0 lb

## 2022-02-25 DIAGNOSIS — E782 Mixed hyperlipidemia: Secondary | ICD-10-CM

## 2022-02-25 DIAGNOSIS — E1169 Type 2 diabetes mellitus with other specified complication: Secondary | ICD-10-CM

## 2022-02-25 DIAGNOSIS — Z6837 Body mass index (BMI) 37.0-37.9, adult: Secondary | ICD-10-CM

## 2022-02-25 DIAGNOSIS — Z9189 Other specified personal risk factors, not elsewhere classified: Secondary | ICD-10-CM

## 2022-02-25 DIAGNOSIS — E559 Vitamin D deficiency, unspecified: Secondary | ICD-10-CM

## 2022-02-25 DIAGNOSIS — E669 Obesity, unspecified: Secondary | ICD-10-CM

## 2022-02-25 DIAGNOSIS — Z7985 Long-term (current) use of injectable non-insulin antidiabetic drugs: Secondary | ICD-10-CM

## 2022-02-25 DIAGNOSIS — F39 Unspecified mood [affective] disorder: Secondary | ICD-10-CM

## 2022-02-26 DIAGNOSIS — F411 Generalized anxiety disorder: Secondary | ICD-10-CM | POA: Diagnosis not present

## 2022-02-26 DIAGNOSIS — F429 Obsessive-compulsive disorder, unspecified: Secondary | ICD-10-CM | POA: Diagnosis not present

## 2022-02-26 DIAGNOSIS — F331 Major depressive disorder, recurrent, moderate: Secondary | ICD-10-CM | POA: Diagnosis not present

## 2022-03-02 DIAGNOSIS — F429 Obsessive-compulsive disorder, unspecified: Secondary | ICD-10-CM | POA: Diagnosis not present

## 2022-03-02 DIAGNOSIS — F419 Anxiety disorder, unspecified: Secondary | ICD-10-CM | POA: Diagnosis not present

## 2022-03-02 DIAGNOSIS — F331 Major depressive disorder, recurrent, moderate: Secondary | ICD-10-CM | POA: Diagnosis not present

## 2022-03-02 DIAGNOSIS — F411 Generalized anxiety disorder: Secondary | ICD-10-CM | POA: Diagnosis not present

## 2022-03-06 DIAGNOSIS — F411 Generalized anxiety disorder: Secondary | ICD-10-CM | POA: Diagnosis not present

## 2022-03-06 DIAGNOSIS — F429 Obsessive-compulsive disorder, unspecified: Secondary | ICD-10-CM | POA: Diagnosis not present

## 2022-03-06 DIAGNOSIS — F331 Major depressive disorder, recurrent, moderate: Secondary | ICD-10-CM | POA: Diagnosis not present

## 2022-03-08 NOTE — Progress Notes (Unsigned)
Chief Complaint:   OBESITY Emily Weber is here to discuss her progress with her obesity treatment plan along with follow-up of her obesity related diagnoses. Emily Weber is on the Category 2 Plan and states she is following her eating plan approximately 90% of the time. Emily Weber states she is gardening 30-60 minutes 1-2 times per week.  Today's visit was #: 5 Starting weight: 250 lbs Starting date: 12/17/2021 Today's weight: 243 lbs Today's date: 02/25/2022 Total lbs lost to date: 7 lbs Total lbs lost since last in-office visit: 1 lb  Interim History: Emily Weber has been eating out less and eating less cakes/sweets, french fries, and pasta. She is surprised that she sis not lose more weight. Upon review of what she is eating, she is eating many off plan foods and not calculating her snack calories.  Subjective:   1. Type 2 diabetes mellitus with other specified complication, without long-term current use of insulin (HCC) Emily Weber is now taking Mounjaro 5 mg, her PCP increase the dose about one week ago. She is tolerated it well with no side effects,  Emily Weber is still eating many simple carbs. Last A1C was 5.2 on 12/29/2021.  2. Mixed diabetic hyperlipidemia associated with type 2 diabetes mellitus (HCC) Emily Weber last labs with her Cardiologist, on 11/04/2021, were: triglycerides = 442, HDL=36, and LDL=72. She is currently taking fish oil per their request.  3. Vitamin D deficiency Emily Weber's Vitamin D level was 26.7 in January 2023. She is taking 10,000 units of Vitamin D with Vitamin K daily per PCP's recommendation.  4. At risk for hypoglycemia Emily Weber is at increased risk for hypoglycemia due to eating habits and diabetes with medications. Emily Weber is not currently taking insulin.   Assessment/Plan:  No orders of the defined types were placed in this encounter.   There are no discontinued medications.   No orders of the defined types were placed in this encounter.    1. Type 2 diabetes mellitus  with other specified complication, without long-term current use of insulin (HCC) Emily Weber will have her medication increased as tolerated,per her PCP. She will follow her prudent nutrition plan, decrease simple carbs, and increase protein intake.  2. Mixed diabetic hyperlipidemia associated with type 2 diabetes mellitus (HCC) Emily Weber is not taking medication. She is taking fish oil and will continue per her PCP, Emily Weber will follow her prudent nutrition plan and decrease both saturated and trans fats. She will also minimize off-plan eating.  3. Vitamin D deficiency Emily Weber will need a review of Vitamin D along with other labs in the near future. She will continue medications per her PCP.  4. At risk for hypoglycemia Emily Weber was given approximately 10 minutes of counseling today regarding prevention of hypoglycemia.  She was advised of symptoms of hypoglycemia.  Emily Weber was instructed to avoid skipping meals and to eat regular protein-rich meals as prescribed on the meal plan.  The patient should have readily available low calorie snacks as needed, such as Emily Weber's fruit snack packs, if blood sugar goes too low.   5. Obesity, current BMI 37 Emily Weber is currently in the action stage of change. As such, her goal is to continue with weight loss efforts. She has agreed to the Category 3 Plan with journaling her intake.  Emily Weber will bring her log, with total calories and total protein, to next office visit. Exercise goals: As is.  Behavioral modification strategies: decreasing simple carbohydrates.  Emily Weber has agreed to follow-up with our clinic in 3 weeks. She was informed  of the importance of frequent follow-up visits to maximize her success with intensive lifestyle modifications for her multiple health conditions.   Objective:   Blood pressure 108/72, pulse 73, temperature 98.6 F (37 C), height 5\' 8"  (1.727 m), weight 243 lb (110.2 kg), SpO2 97 %, unknown if currently breastfeeding. Body mass index is  36.95 kg/m.  General: Cooperative, alert, well developed, in no acute distress. HEENT: Conjunctivae and lids unremarkable. Cardiovascular: Regular rhythm.  Lungs: Normal work of breathing. Neurologic: No focal deficits.   Lab Results  Component Value Date   CREATININE 0.55 (L) 11/04/2021   BUN 12 11/04/2021   NA 139 11/04/2021   K 4.7 11/04/2021   CL 102 11/04/2021   CO2 25 11/04/2021   Lab Results  Component Value Date   ALT 18 11/04/2021   AST 18 11/04/2021   ALKPHOS 52 11/04/2021   BILITOT <0.2 11/04/2021   Lab Results  Component Value Date   HGBA1C 5.1 11/04/2021   Lab Results  Component Value Date   INSULIN 52.5 (H) 12/17/2021   Lab Results  Component Value Date   TSH 0.785 11/04/2021   Lab Results  Component Value Date   CHOL 177 11/04/2021   HDL 36 (L) 11/04/2021   LDLCALC 72 11/04/2021   TRIG 442 (H) 11/04/2021   CHOLHDL 4.9 (H) 11/04/2021   Lab Results  Component Value Date   VD25OH 26.7 (L) 11/04/2021   Lab Results  Component Value Date   WBC 7.5 11/04/2021   HGB 14.0 11/04/2021   HCT 40.1 11/04/2021   MCV 92 11/04/2021   PLT 243 11/04/2021   No results found for: IRON, TIBC, FERRITIN  Attestation Statements:   Reviewed by clinician on day of visit: allergies, medications, problem list, medical history, surgical history, family history, social history, and previous encounter notes.  I01/26/2023, Emily Weber, am acting as transcriptionist for Dr. Paulla Fore, DO.  I have reviewed the above documentation for accuracy and completeness, and I agree with the above. Emily Weber, D.O.  The 21st Century Cures Act was signed into law in 2016 which includes the topic of electronic health records.  This provides immediate access to information in MyChart.  This includes consultation notes, operative notes, office notes, lab results and pathology reports.  If you have any questions about what you read please let 2017 know at your next visit so we can  discuss your concerns and take corrective action if need be.  We are right here with you.

## 2022-03-18 ENCOUNTER — Ambulatory Visit (INDEPENDENT_AMBULATORY_CARE_PROVIDER_SITE_OTHER): Payer: BC Managed Care – PPO | Admitting: Family Medicine

## 2022-03-18 ENCOUNTER — Encounter (INDEPENDENT_AMBULATORY_CARE_PROVIDER_SITE_OTHER): Payer: Self-pay | Admitting: Family Medicine

## 2022-03-18 VITALS — BP 109/73 | HR 65 | Temp 98.5°F | Ht 68.0 in | Wt 240.0 lb

## 2022-03-18 DIAGNOSIS — Z7985 Long-term (current) use of injectable non-insulin antidiabetic drugs: Secondary | ICD-10-CM

## 2022-03-18 DIAGNOSIS — I152 Hypertension secondary to endocrine disorders: Secondary | ICD-10-CM | POA: Diagnosis not present

## 2022-03-18 DIAGNOSIS — E559 Vitamin D deficiency, unspecified: Secondary | ICD-10-CM

## 2022-03-18 DIAGNOSIS — E1159 Type 2 diabetes mellitus with other circulatory complications: Secondary | ICD-10-CM

## 2022-03-18 DIAGNOSIS — Z6836 Body mass index (BMI) 36.0-36.9, adult: Secondary | ICD-10-CM

## 2022-03-18 DIAGNOSIS — E1169 Type 2 diabetes mellitus with other specified complication: Secondary | ICD-10-CM

## 2022-03-18 DIAGNOSIS — E669 Obesity, unspecified: Secondary | ICD-10-CM

## 2022-03-18 DIAGNOSIS — Z9189 Other specified personal risk factors, not elsewhere classified: Secondary | ICD-10-CM

## 2022-03-19 DIAGNOSIS — F411 Generalized anxiety disorder: Secondary | ICD-10-CM | POA: Diagnosis not present

## 2022-03-19 DIAGNOSIS — F429 Obsessive-compulsive disorder, unspecified: Secondary | ICD-10-CM | POA: Diagnosis not present

## 2022-03-19 DIAGNOSIS — F331 Major depressive disorder, recurrent, moderate: Secondary | ICD-10-CM | POA: Diagnosis not present

## 2022-03-23 DIAGNOSIS — H00019 Hordeolum externum unspecified eye, unspecified eyelid: Secondary | ICD-10-CM | POA: Diagnosis not present

## 2022-03-23 DIAGNOSIS — Z6835 Body mass index (BMI) 35.0-35.9, adult: Secondary | ICD-10-CM | POA: Diagnosis not present

## 2022-03-26 NOTE — Progress Notes (Unsigned)
Chief Complaint:   OBESITY Emily Weber is here to discuss her progress with her obesity treatment plan along with follow-up of her obesity related diagnoses. Emily Weber is on the Category 2 Plan and states she is following her eating plan approximately 95% of the time. Emily Weber states she is not currently exercising.  Today's visit was #: 6 Starting weight: 250 lbs Starting date: 12/17/2021 Today's weight: 240 lbs Today's date: 03/18/2022 Total lbs lost to date: 10 Total lbs lost since last in-office visit: 3  Interim History: Emily Weber is here for a follow up office visit. We reviewed her meal plan and all questions were answered. Patient's food recall appears to be accurate and consistent with what is on plan when she is following it. When eating on plan, her hunger and cravings are well controlled.   Pt's water is only 50.7 oz per day.  Subjective:   1. Type 2 diabetes mellitus with other specified complication, without long-term current use of insulin (HCC) Pt has been on Mounjaro 7.5 mg weekly and is tolerating it well. She is able to eat foods on plan and is not skipping meals/foods at all. Her A1c is 5.2.  2. Hypertension associated with diabetes (HCC) Cardiovascular ROS: no chest pain or dyspnea on exertion. Pt has a history of being on BP meds but is not currently. She is diet controlled.  3. Vitamin D deficiency She is currently taking OTC vitamin D 10,000 IU each day. She denies nausea, vomiting or muscle weakness.  4. At risk for dehydration Emily Weber is at risk for dehydration due to inadequate water intake.  Assessment/Plan:   1. Type 2 diabetes mellitus with other specified complication, without long-term current use of insulin (HCC) Good blood sugar control is important to decrease the likelihood of diabetic complications such as nephropathy, neuropathy, limb loss, blindness, coronary artery disease, and death. Intensive lifestyle modification including diet, exercise and  weight loss are the first line of treatment for diabetes.  Continue meds per endocrinologist, Dr. Katrinka Blazing. Pt advised on the importance of not missing meals and decreasing simple carbs.  2. Hypertension associated with diabetes (HCC) BP at goal. Veva is working on healthy weight loss and exercise to improve blood pressure control. We will watch for signs of hypotension as she continues her lifestyle modifications. Continue prudent nutritional plan and weight loss.  3. Vitamin D deficiency Low Vitamin D level contributes to fatigue and are associated with obesity, breast, and colon cancer. She agrees to continue to take OTC Vitamin D 10,000 IU daily and will follow-up for routine testing of Vitamin D, at least 2-3 times per year to avoid over-replacement. Recheck Vitamin D level in the near future.  4. At risk for dehydration [name] is at higher than average risk of dehydration. Emily Weber was given more than 9 minutes of proper hydration counseling today. We discussed the signs and symptoms of dehydration, some of which may include muscle cramping, constipation or even orthostatic symptoms. Counseling on the prevention of dehydration was also provided today. Emily Weber is at risk for dehydration due to weight loss, lifestyle and behavorial habits and possibly due to taking certain medication(s). She was encouraged to adequately hydrate and monitor fluid status to avoid dehydration as well as weight loss plateaus. Unless pre-existing renal or cardiopulmonary conditions exist, in which patient was told to limit their fluid intake, I recommended roughly one half of their weight in pounds to be the approximate ounces of non-caloric, non-caffeinated beverages they should drink  per day; including more if they are engaging in exercise.  Emily Weber is at higher than average risk of dehydration. Emily Weber was given more than 9 minutes of proper hydration counseling today. We discussed the signs and symptoms of dehydration, some  of which may include muscle cramping, constipation, or even orthostatic symptoms. Counseling on the prevention of dehydration was also provided today. Emily Weber is at risk for dehydration due to weight loss, lifestyle and behavorial habits, and possibly due to taking certain medication(s). She was encouraged to adequately hydrate and monitor fluid status to avoid dehydration as well as weight loss plateaus. Unless pre-existing renal or cardiopulmonary conditions exist, which pt was told to limit their fluid intake. I recommended roughly one half of their weight in pounds to be the approximate ounces of non-caloric, non-caffeinated beverages they should drink per day; including more if they are engaging in exercise.  5. Obesity, Current BMI 36.6 Emily Weber is currently in the action stage of change. As such, her goal is to continue with weight loss efforts. She has agreed to change to keeping a food journal and adhering to recommended goals of 1600-1700 calories and 130+ grams protein.   Exercise goals:  As is  Behavioral modification strategies: increasing water intake, planning for success, and keeping a strict food journal.  Emily Weber has agreed to follow-up with our clinic in 3 weeks. She was informed of the importance of frequent follow-up visits to maximize her success with intensive lifestyle modifications for her multiple health conditions.   Objective:   Blood pressure 109/73, pulse 65, temperature 98.5 F (36.9 C), height 5\' 8"  (1.727 m), weight 240 lb (108.9 kg), SpO2 99 %, unknown if currently breastfeeding. Body mass index is 36.49 kg/m.  General: Cooperative, alert, well developed, in no acute distress. HEENT: Conjunctivae and lids unremarkable. Cardiovascular: Regular rhythm.  Lungs: Normal work of breathing. Neurologic: No focal deficits.   Lab Results  Component Value Date   CREATININE 0.55 (L) 11/04/2021   BUN 12 11/04/2021   NA 139 11/04/2021   K 4.7 11/04/2021   CL 102  11/04/2021   CO2 25 11/04/2021   Lab Results  Component Value Date   ALT 18 11/04/2021   AST 18 11/04/2021   ALKPHOS 52 11/04/2021   BILITOT <0.2 11/04/2021   Lab Results  Component Value Date   HGBA1C 5.1 11/04/2021   Lab Results  Component Value Date   INSULIN 52.5 (H) 12/17/2021   Lab Results  Component Value Date   TSH 0.785 11/04/2021   Lab Results  Component Value Date   CHOL 177 11/04/2021   HDL 36 (L) 11/04/2021   LDLCALC 72 11/04/2021   TRIG 442 (H) 11/04/2021   CHOLHDL 4.9 (H) 11/04/2021   Lab Results  Component Value Date   VD25OH 26.7 (L) 11/04/2021   Lab Results  Component Value Date   WBC 7.5 11/04/2021   HGB 14.0 11/04/2021   HCT 40.1 11/04/2021   MCV 92 11/04/2021   PLT 243 11/04/2021    Attestation Statements:   Reviewed by clinician on day of visit: allergies, medications, problem list, medical history, surgical history, family history, social history, and previous encounter notes.  I, 11/06/2021, BS, CMA, am acting as transcriptionist for Kyung Rudd, DO.  I have reviewed the above documentation for accuracy and completeness, and I agree with the above. -  ***

## 2022-03-27 DIAGNOSIS — R319 Hematuria, unspecified: Secondary | ICD-10-CM | POA: Diagnosis not present

## 2022-03-30 DIAGNOSIS — F419 Anxiety disorder, unspecified: Secondary | ICD-10-CM | POA: Diagnosis not present

## 2022-03-30 DIAGNOSIS — F429 Obsessive-compulsive disorder, unspecified: Secondary | ICD-10-CM | POA: Diagnosis not present

## 2022-03-30 DIAGNOSIS — F331 Major depressive disorder, recurrent, moderate: Secondary | ICD-10-CM | POA: Diagnosis not present

## 2022-03-30 DIAGNOSIS — F411 Generalized anxiety disorder: Secondary | ICD-10-CM | POA: Diagnosis not present

## 2022-04-01 ENCOUNTER — Ambulatory Visit (INDEPENDENT_AMBULATORY_CARE_PROVIDER_SITE_OTHER): Payer: BC Managed Care – PPO | Admitting: Family Medicine

## 2022-04-01 ENCOUNTER — Encounter (INDEPENDENT_AMBULATORY_CARE_PROVIDER_SITE_OTHER): Payer: Self-pay | Admitting: Family Medicine

## 2022-04-01 VITALS — BP 115/78 | HR 74 | Temp 98.0°F | Ht 68.0 in | Wt 234.0 lb

## 2022-04-01 DIAGNOSIS — Z9189 Other specified personal risk factors, not elsewhere classified: Secondary | ICD-10-CM

## 2022-04-01 DIAGNOSIS — E538 Deficiency of other specified B group vitamins: Secondary | ICD-10-CM | POA: Diagnosis not present

## 2022-04-01 DIAGNOSIS — E669 Obesity, unspecified: Secondary | ICD-10-CM

## 2022-04-01 DIAGNOSIS — E559 Vitamin D deficiency, unspecified: Secondary | ICD-10-CM | POA: Diagnosis not present

## 2022-04-01 DIAGNOSIS — Z6835 Body mass index (BMI) 35.0-35.9, adult: Secondary | ICD-10-CM

## 2022-04-01 DIAGNOSIS — Z7985 Long-term (current) use of injectable non-insulin antidiabetic drugs: Secondary | ICD-10-CM

## 2022-04-01 DIAGNOSIS — E1169 Type 2 diabetes mellitus with other specified complication: Secondary | ICD-10-CM | POA: Diagnosis not present

## 2022-04-02 DIAGNOSIS — E669 Obesity, unspecified: Secondary | ICD-10-CM | POA: Diagnosis not present

## 2022-04-02 DIAGNOSIS — Z6835 Body mass index (BMI) 35.0-35.9, adult: Secondary | ICD-10-CM | POA: Diagnosis not present

## 2022-04-02 DIAGNOSIS — R310 Gross hematuria: Secondary | ICD-10-CM | POA: Diagnosis not present

## 2022-04-02 DIAGNOSIS — N200 Calculus of kidney: Secondary | ICD-10-CM | POA: Diagnosis not present

## 2022-04-02 LAB — COMPREHENSIVE METABOLIC PANEL
ALT: 19 IU/L (ref 0–32)
AST: 15 IU/L (ref 0–40)
Albumin/Globulin Ratio: 1.9 (ref 1.2–2.2)
Albumin: 4.8 g/dL (ref 3.8–4.8)
Alkaline Phosphatase: 48 IU/L (ref 44–121)
BUN/Creatinine Ratio: 26 — ABNORMAL HIGH (ref 9–23)
BUN: 16 mg/dL (ref 6–20)
Bilirubin Total: 0.3 mg/dL (ref 0.0–1.2)
CO2: 18 mmol/L — ABNORMAL LOW (ref 20–29)
Calcium: 9.5 mg/dL (ref 8.7–10.2)
Chloride: 106 mmol/L (ref 96–106)
Creatinine, Ser: 0.61 mg/dL (ref 0.57–1.00)
Globulin, Total: 2.5 g/dL (ref 1.5–4.5)
Glucose: 77 mg/dL (ref 70–99)
Potassium: 4.5 mmol/L (ref 3.5–5.2)
Sodium: 139 mmol/L (ref 134–144)
Total Protein: 7.3 g/dL (ref 6.0–8.5)
eGFR: 121 mL/min/{1.73_m2} (ref >59)

## 2022-04-02 LAB — HEMOGLOBIN A1C
Est. average glucose Bld gHb Est-mCnc: 94 mg/dL
Hgb A1c MFr Bld: 4.9 % (ref 4.8–5.6)

## 2022-04-02 LAB — LIPID PANEL
Chol/HDL Ratio: 4 ratio (ref 0.0–4.4)
Cholesterol, Total: 151 mg/dL (ref 100–199)
HDL: 38 mg/dL — ABNORMAL LOW (ref >39)
LDL Chol Calc (NIH): 83 mg/dL (ref 0–99)
Triglycerides: 172 mg/dL — ABNORMAL HIGH (ref 0–149)
VLDL Cholesterol Cal: 30 mg/dL (ref 5–40)

## 2022-04-02 LAB — VITAMIN D 25 HYDROXY (VIT D DEFICIENCY, FRACTURES): Vit D, 25-Hydroxy: 81.7 ng/mL (ref 30.0–100.0)

## 2022-04-02 LAB — INSULIN, RANDOM: INSULIN: 36.8 u[IU]/mL — ABNORMAL HIGH (ref 2.6–24.9)

## 2022-04-02 LAB — VITAMIN B12: Vitamin B-12: 665 pg/mL (ref 232–1245)

## 2022-04-06 DIAGNOSIS — Z9189 Other specified personal risk factors, not elsewhere classified: Secondary | ICD-10-CM | POA: Insufficient documentation

## 2022-04-06 DIAGNOSIS — E538 Deficiency of other specified B group vitamins: Secondary | ICD-10-CM

## 2022-04-06 HISTORY — DX: Deficiency of other specified B group vitamins: E53.8

## 2022-04-06 HISTORY — DX: Other specified personal risk factors, not elsewhere classified: Z91.89

## 2022-04-17 DIAGNOSIS — F331 Major depressive disorder, recurrent, moderate: Secondary | ICD-10-CM | POA: Diagnosis not present

## 2022-04-17 DIAGNOSIS — F429 Obsessive-compulsive disorder, unspecified: Secondary | ICD-10-CM | POA: Diagnosis not present

## 2022-04-17 DIAGNOSIS — F411 Generalized anxiety disorder: Secondary | ICD-10-CM | POA: Diagnosis not present

## 2022-04-27 DIAGNOSIS — F419 Anxiety disorder, unspecified: Secondary | ICD-10-CM | POA: Diagnosis not present

## 2022-04-27 DIAGNOSIS — F411 Generalized anxiety disorder: Secondary | ICD-10-CM | POA: Diagnosis not present

## 2022-04-27 DIAGNOSIS — F331 Major depressive disorder, recurrent, moderate: Secondary | ICD-10-CM | POA: Diagnosis not present

## 2022-04-27 DIAGNOSIS — F429 Obsessive-compulsive disorder, unspecified: Secondary | ICD-10-CM | POA: Diagnosis not present

## 2022-04-28 ENCOUNTER — Encounter (INDEPENDENT_AMBULATORY_CARE_PROVIDER_SITE_OTHER): Payer: Self-pay | Admitting: Family Medicine

## 2022-04-28 ENCOUNTER — Ambulatory Visit (INDEPENDENT_AMBULATORY_CARE_PROVIDER_SITE_OTHER): Payer: BC Managed Care – PPO | Admitting: Family Medicine

## 2022-04-28 VITALS — BP 105/69 | HR 79 | Temp 98.3°F | Ht 68.0 in | Wt 229.0 lb

## 2022-04-28 DIAGNOSIS — Z9189 Other specified personal risk factors, not elsewhere classified: Secondary | ICD-10-CM

## 2022-04-28 DIAGNOSIS — E1169 Type 2 diabetes mellitus with other specified complication: Secondary | ICD-10-CM

## 2022-04-28 DIAGNOSIS — E559 Vitamin D deficiency, unspecified: Secondary | ICD-10-CM

## 2022-04-28 DIAGNOSIS — Z6834 Body mass index (BMI) 34.0-34.9, adult: Secondary | ICD-10-CM

## 2022-04-28 DIAGNOSIS — E538 Deficiency of other specified B group vitamins: Secondary | ICD-10-CM | POA: Diagnosis not present

## 2022-04-28 DIAGNOSIS — E669 Obesity, unspecified: Secondary | ICD-10-CM

## 2022-04-28 DIAGNOSIS — E781 Pure hyperglyceridemia: Secondary | ICD-10-CM | POA: Diagnosis not present

## 2022-04-28 DIAGNOSIS — Z7985 Long-term (current) use of injectable non-insulin antidiabetic drugs: Secondary | ICD-10-CM

## 2022-05-01 DIAGNOSIS — F331 Major depressive disorder, recurrent, moderate: Secondary | ICD-10-CM | POA: Diagnosis not present

## 2022-05-01 DIAGNOSIS — F411 Generalized anxiety disorder: Secondary | ICD-10-CM | POA: Diagnosis not present

## 2022-05-03 NOTE — Progress Notes (Signed)
Chief Complaint:   OBESITY Emily Weber is here to discuss her progress with her obesity treatment plan along with follow-up of her obesity related diagnoses. Emily Weber is on keeping a food journal and adhering to recommended goals of 1600-1700 calories and 130+ protein and states she is following her eating plan approximately 95% of the time. Emily Weber states she is not exercising.  Today's visit was #: 8 Starting weight: 250 lbs Starting date: 12/17/2021 Today's weight: 229 lbs Today's date: 04/28/2022 Total lbs lost to date: 21 lbs Total lbs lost since last in-office visit: 5 lbs  Interim History: Emily Weber is bored with eating sandwiches everyday.  She has tried some microwave meals.  She is here today to review labs drawn at last office visit.  She was not able to journal but thinks in the future it would be helpful.    Subjective:   1. Type 2 diabetes mellitus with other specified complication, without long-term current use of insulin (HCC) Discussed labs with patient today. Emily Weber has not had any low or high blood sugars, she feels well.  She has been tolerating Mounjaro well and will be starting Mounjaro 10 mg dose every Thursday.    2. Hypertriglyceridemia Discussed labs with patient today. Emily Weber's HDL is low but has improved.  Triglycerides are very improved.  She has a history of diabetes mellitus, but she is not taking a statin and does not want to be.  3. Vitamin D deficiency Discussed labs with patient today. Emily Weber is taking OTC 10,000 IU daily.  Emily Weber saw level at 81.7, now has changed to taking every other day.  4. Vitamin B12 deficiency Discussed labs with patient today. Has been off B12 supplement for 3 months. B12 is at goal. Her energy is good.  5. At risk for dehydration Emily Weber is at risk for dehydration due to inadequate water intake.   Assessment/Plan:  No orders of the defined types were placed in this encounter.   There are no discontinued medications.    No orders of the defined types were placed in this encounter.    1. Type 2 diabetes mellitus with other specified complication, without long-term current use of insulin (HCC) Emily Weber's A1c is at goal, her fasting insulin was >30. Her Emily Weber is prescribed by an outside provider.  Continue prudent nutritional plan and weight loss. Increase exercise as tolerated.  2. Hypertriglyceridemia Emily Weber is to start taking krill oil because she does not eat fish.  Continue prudent nutritional plan and weight loss and increase exercise as tolerated.  3. Vitamin D deficiency Low Vitamin D level contributes to fatigue and are associated with obesity, breast, and colon cancer. She agrees to continue to take OTC Vitamin D @10 ,000 IU every other day or 5,000 daily and will follow-up for routine testing of Vitamin D, at least 2-3 times per year to avoid over-replacement. Recheck labs in 3-4 months.   4. Vitamin B12 deficiency Continue prudent nutritional plan and weight loss. No need for supplement, B12 is at goal.  5. At risk for dehydration Emily Weber was given approximately 9 minutes of dehydration prevention counseling today. Emily Weber is at risk for dehydration due to weight loss and current medication(s). She was encouraged to hydrate and monitor fluid status to avoid dehydration as weight loss plateaus.   6. Obesity, Current BMI 34.9 Goal:  Emily Weber will start walking 5-10 minutes 3 days per week.  Emily Weber is currently in the action stage of change. As such, her goal is to continue with  weight loss efforts. She has agreed to change to the Category 3 Plan with breakfast and lunch options.    Exercise goals: As is.  Behavioral modification strategies: increasing lean protein intake, decreasing simple carbohydrates, and planning for success.  Emily Weber has agreed to follow-up with our clinic in 3 weeks. She was informed of the importance of frequent follow-up visits to maximize her success with intensive  lifestyle modifications for her multiple health conditions.   Objective:   Blood pressure 105/69, pulse 79, temperature 98.3 F (36.8 C), height 5\' 8"  (1.727 m), weight 229 lb (103.9 kg), SpO2 99 %, unknown if currently breastfeeding. Body mass index is 34.82 kg/m.  General: Cooperative, alert, well developed, in no acute distress. HEENT: Conjunctivae and lids unremarkable. Cardiovascular: Regular rhythm.  Lungs: Normal work of breathing. Neurologic: No focal deficits.   Lab Results  Component Value Date   CREATININE 0.61 04/01/2022   BUN 16 04/01/2022   NA 139 04/01/2022   K 4.5 04/01/2022   CL 106 04/01/2022   CO2 18 (L) 04/01/2022   Lab Results  Component Value Date   ALT 19 04/01/2022   AST 15 04/01/2022   ALKPHOS 48 04/01/2022   BILITOT 0.3 04/01/2022   Lab Results  Component Value Date   HGBA1C 4.9 04/01/2022   HGBA1C 5.1 11/04/2021   Lab Results  Component Value Date   INSULIN 36.8 (H) 04/01/2022   INSULIN 52.5 (H) 12/17/2021   Lab Results  Component Value Date   TSH 0.785 11/04/2021   Lab Results  Component Value Date   CHOL 151 04/01/2022   HDL 38 (L) 04/01/2022   LDLCALC 83 04/01/2022   TRIG 172 (H) 04/01/2022   CHOLHDL 4.0 04/01/2022   Lab Results  Component Value Date   VD25OH 81.7 04/01/2022   VD25OH 26.7 (L) 11/04/2021   Lab Results  Component Value Date   WBC 7.5 11/04/2021   HGB 14.0 11/04/2021   HCT 40.1 11/04/2021   MCV 92 11/04/2021   PLT 243 11/04/2021   No results found for: "IRON", "TIBC", "FERRITIN"  Attestation Statements:   Reviewed by clinician on day of visit: allergies, medications, problem list, medical history, surgical history, family history, social history, and previous encounter notes.  I, 11/06/2021, RMA, am acting as [employer] for [employer] manager, DO.   I have reviewed the above documentation for accuracy and completeness, and I agree with the above. [employer], D.O.  The 21st Century  Cures Act was signed into law in 2016 which includes the topic of electronic health records.  This provides immediate access to information in MyChart.  This includes consultation notes, operative notes, office notes, lab results and pathology reports.  If you have any questions about what you read please let 2017 know at your next visit so we can discuss your concerns and take corrective action if need be.  We are right here with you.

## 2022-05-20 ENCOUNTER — Encounter (INDEPENDENT_AMBULATORY_CARE_PROVIDER_SITE_OTHER): Payer: Self-pay

## 2022-05-20 ENCOUNTER — Encounter (INDEPENDENT_AMBULATORY_CARE_PROVIDER_SITE_OTHER): Payer: Self-pay | Admitting: Family Medicine

## 2022-05-20 ENCOUNTER — Ambulatory Visit (INDEPENDENT_AMBULATORY_CARE_PROVIDER_SITE_OTHER): Payer: BC Managed Care – PPO | Admitting: Family Medicine

## 2022-05-20 VITALS — BP 104/66 | HR 66 | Temp 98.3°F | Ht 68.0 in | Wt 227.0 lb

## 2022-05-20 DIAGNOSIS — Z7985 Long-term (current) use of injectable non-insulin antidiabetic drugs: Secondary | ICD-10-CM

## 2022-05-20 DIAGNOSIS — E669 Obesity, unspecified: Secondary | ICD-10-CM | POA: Diagnosis not present

## 2022-05-20 DIAGNOSIS — Z6834 Body mass index (BMI) 34.0-34.9, adult: Secondary | ICD-10-CM | POA: Diagnosis not present

## 2022-05-20 DIAGNOSIS — E1169 Type 2 diabetes mellitus with other specified complication: Secondary | ICD-10-CM | POA: Diagnosis not present

## 2022-05-25 DIAGNOSIS — F411 Generalized anxiety disorder: Secondary | ICD-10-CM | POA: Diagnosis not present

## 2022-05-25 DIAGNOSIS — F429 Obsessive-compulsive disorder, unspecified: Secondary | ICD-10-CM | POA: Diagnosis not present

## 2022-05-25 DIAGNOSIS — F331 Major depressive disorder, recurrent, moderate: Secondary | ICD-10-CM | POA: Diagnosis not present

## 2022-05-25 DIAGNOSIS — F419 Anxiety disorder, unspecified: Secondary | ICD-10-CM | POA: Diagnosis not present

## 2022-05-27 NOTE — Progress Notes (Signed)
Chief Complaint:   OBESITY Emily Weber is here to discuss her progress with her obesity treatment plan along with follow-up of her obesity related diagnoses. [name] is on the Category 3 Plan with breakfast and lunch options and states she is following her eating plan approximately 90% of the time. Jenese states she is walking 15 minutes 2 times per week.  Today's visit was #: 9 Starting weight: 250 lbs Starting date: 12/17/2021 Today's weight: 227 lbs Today's date: 05/20/2022 Total lbs lost to date: 23 Total lbs lost since last in-office visit: 2  Interim History: [name] went to [place] and drank alcohol and ate some off plan foods. She is measuring proteins and eating them. The lunch options given last OV help very much.   Subjective:   1. Type 2 diabetes mellitus with other specified complication, without long-term current use of insulin (HCC) Pt denies hunger and overall good control of cravings. She denies high or low blood sugars.  Assessment/Plan:  No orders of the defined types were placed in this encounter.   There are no discontinued medications.   No orders of the defined types were placed in this encounter.    1. Type 2 diabetes mellitus with other specified complication, without long-term current use of insulin (HCC) Good blood sugar control is important to decrease the likelihood of diabetic complications such as nephropathy, neuropathy, limb loss, blindness, coronary artery disease, and death. Intensive lifestyle modification including diet, exercise and weight loss are the first line of treatment for diabetes. Continue Mounjaro at 10 mg weekly per PCP. Increase protein and decrease simple carbs.  2. Obesity, Current BMI 34.5 [name] is currently in the action stage of change. As such, her goal is to continue with weight loss efforts. She has agreed to the Category 3 Plan with breakfast and lunch options.   Exercise goals:  Walk 2+ day per week 15-20 minutes each  time.  Behavioral modification strategies: decreasing simple carbohydrates and emotional eating strategies.  [name] has agreed to follow-up with our clinic in 3-4 weeks. She was informed of the importance of frequent follow-up visits to maximize her success with intensive lifestyle modifications for her multiple health conditions.   Objective:   Blood pressure 104/66, pulse 66, temperature 98.3 F (36.8 C), height 5\' 8"  (1.727 m), weight 227 lb (103 kg), SpO2 100 %, unknown if currently breastfeeding. Body mass index is 34.52 kg/m.  General: Cooperative, alert, well developed, in no acute distress. HEENT: Conjunctivae and lids unremarkable. Cardiovascular: Regular rhythm.  Lungs: Normal work of breathing. Neurologic: No focal deficits.   Lab Results  Component Value Date   CREATININE 0.61 04/01/2022   BUN 16 04/01/2022   NA 139 04/01/2022   K 4.5 04/01/2022   CL 106 04/01/2022   CO2 18 (L) 04/01/2022   Lab Results  Component Value Date   ALT 19 04/01/2022   AST 15 04/01/2022   ALKPHOS 48 04/01/2022   BILITOT 0.3 04/01/2022   Lab Results  Component Value Date   HGBA1C 4.9 04/01/2022   HGBA1C 5.1 11/04/2021   Lab Results  Component Value Date   INSULIN 36.8 (H) 04/01/2022   INSULIN 52.5 (H) 12/17/2021   Lab Results  Component Value Date   TSH 0.785 11/04/2021   Lab Results  Component Value Date   CHOL 151 04/01/2022   HDL 38 (L) 04/01/2022   LDLCALC 83 04/01/2022   TRIG 172 (H) 04/01/2022   CHOLHDL 4.0 04/01/2022   Lab Results  Component Value Date   VD25OH 81.7 04/01/2022   VD25OH 26.7 (L) 11/04/2021   Lab Results  Component Value Date   WBC 7.5 11/04/2021   HGB 14.0 11/04/2021   HCT 40.1 11/04/2021   MCV 92 11/04/2021   PLT 243 11/04/2021   Attestation Statements:   Reviewed by clinician on day of visit: allergies, medications, problem list, medical history, surgical history, family history, social history, and previous encounter notes.  Time  spent on visit including pre-visit chart review and post-visit care and charting was 20 minutes.   I, Kyung Rudd, BS, CMA, am acting as transcriptionist for [employer], DO.    I have reviewed the above documentation for accuracy and completeness, and I agree with the above. Carlye Grippe, D.O.  The 21st Century Cures Act was signed into law in 2016 which includes the topic of electronic health records.  This provides immediate access to information in MyChart.  This includes consultation notes, operative notes, office notes, lab results and pathology reports.  If you have any questions about what you read please let us know at your next visit so we can discuss your concerns and take corrective action if need be.  We are right here with you.

## 2022-06-01 DIAGNOSIS — Z87442 Personal history of urinary calculi: Secondary | ICD-10-CM | POA: Diagnosis not present

## 2022-06-01 DIAGNOSIS — R310 Gross hematuria: Secondary | ICD-10-CM | POA: Diagnosis not present

## 2022-06-02 DIAGNOSIS — F331 Major depressive disorder, recurrent, moderate: Secondary | ICD-10-CM | POA: Diagnosis not present

## 2022-06-02 DIAGNOSIS — F429 Obsessive-compulsive disorder, unspecified: Secondary | ICD-10-CM | POA: Diagnosis not present

## 2022-06-02 DIAGNOSIS — F411 Generalized anxiety disorder: Secondary | ICD-10-CM | POA: Diagnosis not present

## 2022-06-18 ENCOUNTER — Ambulatory Visit (INDEPENDENT_AMBULATORY_CARE_PROVIDER_SITE_OTHER): Payer: BC Managed Care – PPO | Admitting: Family Medicine

## 2022-06-18 ENCOUNTER — Encounter (INDEPENDENT_AMBULATORY_CARE_PROVIDER_SITE_OTHER): Payer: Self-pay | Admitting: Family Medicine

## 2022-06-18 VITALS — BP 107/74 | HR 66 | Temp 98.7°F | Ht 68.0 in | Wt 222.0 lb

## 2022-06-18 DIAGNOSIS — E669 Obesity, unspecified: Secondary | ICD-10-CM

## 2022-06-18 DIAGNOSIS — E781 Pure hyperglyceridemia: Secondary | ICD-10-CM

## 2022-06-18 DIAGNOSIS — Z6833 Body mass index (BMI) 33.0-33.9, adult: Secondary | ICD-10-CM

## 2022-06-18 DIAGNOSIS — Z7985 Long-term (current) use of injectable non-insulin antidiabetic drugs: Secondary | ICD-10-CM

## 2022-06-18 DIAGNOSIS — E1169 Type 2 diabetes mellitus with other specified complication: Secondary | ICD-10-CM | POA: Diagnosis not present

## 2022-06-18 DIAGNOSIS — E559 Vitamin D deficiency, unspecified: Secondary | ICD-10-CM

## 2022-06-18 HISTORY — DX: Pure hyperglyceridemia: E78.1

## 2022-06-19 DIAGNOSIS — R319 Hematuria, unspecified: Secondary | ICD-10-CM | POA: Diagnosis not present

## 2022-06-19 DIAGNOSIS — R310 Gross hematuria: Secondary | ICD-10-CM | POA: Diagnosis not present

## 2022-06-23 DIAGNOSIS — F429 Obsessive-compulsive disorder, unspecified: Secondary | ICD-10-CM | POA: Diagnosis not present

## 2022-06-23 DIAGNOSIS — F331 Major depressive disorder, recurrent, moderate: Secondary | ICD-10-CM | POA: Diagnosis not present

## 2022-06-23 DIAGNOSIS — F411 Generalized anxiety disorder: Secondary | ICD-10-CM | POA: Diagnosis not present

## 2022-06-24 DIAGNOSIS — R310 Gross hematuria: Secondary | ICD-10-CM | POA: Diagnosis not present

## 2022-06-25 NOTE — Progress Notes (Unsigned)
Chief Complaint:   OBESITY Emily Weber is here to discuss her progress with her obesity treatment plan along with follow-up of her obesity related diagnoses. Emily Weber is on the Category 3 Plan with breakfast and lunch options with breakfast and lunch options and states she is following her eating plan approximately 95% of the time. Emily Weber states she is walking 20 minutes 2-3 times per week.  Today's visit was #: 10 Starting weight: 250 lbs Starting date: 12/17/2021 Today's weight: 222 lbs Today's date: 06/18/2022 Total lbs lost to date: 28 Total lbs lost since last in-office visit: 5  Interim History: Emily Weber has been on plan and doing great. She met the exercise goals we made at her last OV and likes the lunch options as well.  Subjective:   1. Type 2 diabetes mellitus with other specified complication, without long-term current use of insulin (HCC) Emily Weber is not checking her blood sugars at home but feels fine. She has a little bit of hunger and cravings at weekends of Emily Weber right before the next dose.  2. Hypertriglyceridemia Labs last checked 6/21, and pt triglycerides are too high and HDL is too low.  3. Vitamin D deficiency Pt decreased from OTC Vit D 1000 IU daily to every other day when we checked her Vit D level at 81.7.  Assessment/Plan:  No orders of the defined types were placed in this encounter.   There are no discontinued medications.   No orders of the defined types were placed in this encounter.    1. Type 2 diabetes mellitus with other specified complication, without long-term current use of insulin (HCC) Good blood sugar control is important to decrease the likelihood of diabetic complications such as nephropathy, neuropathy, limb loss, blindness, coronary artery disease, and death. Intensive lifestyle modification including diet, exercise and weight loss are the first line of treatment for diabetes. Continue Emily Weber at same dose and increase protein intake.  Counseling done.  2. Hypertriglyceridemia Cardiovascular risk and specific lipid/LDL goals reviewed.  We discussed several lifestyle modifications today and Emily Weber will continue to work on diet, exercise and weight loss efforts. Orders and follow up as documented in patient record. Continue prudent nutritional plan and increase exercise.  Counseling Intensive lifestyle modifications are the first line treatment for this issue. Dietary changes: Increase soluble fiber. Decrease simple carbohydrates. Exercise changes: Moderate to vigorous-intensity aerobic activity 150 minutes per week if tolerated. Lipid-lowering medications: see documented in medical record.  3. Vitamin D deficiency Low Vitamin D level contributes to fatigue and are associated with obesity, breast, and colon cancer. She agrees to continue to take OTC Vitamin D 10,000 IU every other day and will follow-up for routine testing of Vitamin D, at least 2-3 times per year to avoid over-replacement.  4. Obesity, Current BMI 33.8 Emily Weber is currently in the action stage of change. As such, her goal is to continue with weight loss efforts. She has agreed to the Category 3 Plan with breakfast and lunch options.   Increase protein intake with higher protein snacks on Tuesday and Wednesday when pt feels Emily Weber wearing off.  Exercise goals:  Increase exercise to 20 minute 3-4 times a week.  Behavioral modification strategies: decreasing simple carbohydrates, increasing water intake, and planning for success.  Emily Weber has agreed to follow-up with our clinic in 3-4 weeks. She was informed of the importance of frequent follow-up visits to maximize her success with intensive lifestyle modifications for her multiple health conditions.   Objective:   Blood  pressure 107/74, pulse 66, temperature 98.7 F (37.1 C), height 5' 8"  (1.727 m), weight 222 lb (100.7 kg), SpO2 97 %, unknown if currently breastfeeding. Body mass index is 33.75  kg/m.  General: Cooperative, alert, well developed, in no acute distress. HEENT: Conjunctivae and lids unremarkable. Cardiovascular: Regular rhythm.  Lungs: Normal work of breathing. Neurologic: No focal deficits.   Lab Results  Component Value Date   CREATININE 0.61 04/01/2022   BUN 16 04/01/2022   NA 139 04/01/2022   K 4.5 04/01/2022   CL 106 04/01/2022   CO2 18 (L) 04/01/2022   Lab Results  Component Value Date   ALT 19 04/01/2022   AST 15 04/01/2022   ALKPHOS 48 04/01/2022   BILITOT 0.3 04/01/2022   Lab Results  Component Value Date   HGBA1C 4.9 04/01/2022   HGBA1C 5.1 11/04/2021   Lab Results  Component Value Date   INSULIN 36.8 (H) 04/01/2022   INSULIN 52.5 (H) 12/17/2021   Lab Results  Component Value Date   TSH 0.785 11/04/2021   Lab Results  Component Value Date   CHOL 151 04/01/2022   HDL 38 (L) 04/01/2022   LDLCALC 83 04/01/2022   TRIG 172 (H) 04/01/2022   CHOLHDL 4.0 04/01/2022   Lab Results  Component Value Date   VD25OH 81.7 04/01/2022   VD25OH 26.7 (L) 11/04/2021   Lab Results  Component Value Date   WBC 7.5 11/04/2021   HGB 14.0 11/04/2021   HCT 40.1 11/04/2021   MCV 92 11/04/2021   PLT 243 11/04/2021    Attestation Statements:   Reviewed by clinician on day of visit: allergies, medications, problem list, medical history, surgical history, family history, social history, and previous encounter notes.  Time spent on visit including pre-visit chart review and post-visit care and charting was 30 minutes.   I, Emily Weber, BS, CMA, am acting as transcriptionist for [employer], DO.  I have reviewed the above documentation for accuracy and completeness, and I agree with the above. Emily Weber, D.O.  The [place] was signed into law in 2016 which includes the topic of electronic health records.  This provides immediate access to information in MyChart.  This includes consultation notes, operative notes,  office notes, lab results and pathology reports.  If you have any questions about what you read please let us know at your next visit so we can discuss your concerns and take corrective action if need be.  We are right here with you.

## 2022-07-02 DIAGNOSIS — F411 Generalized anxiety disorder: Secondary | ICD-10-CM | POA: Diagnosis not present

## 2022-07-02 DIAGNOSIS — F429 Obsessive-compulsive disorder, unspecified: Secondary | ICD-10-CM | POA: Diagnosis not present

## 2022-07-02 DIAGNOSIS — F331 Major depressive disorder, recurrent, moderate: Secondary | ICD-10-CM | POA: Diagnosis not present

## 2022-07-03 DIAGNOSIS — T23209A Burn of second degree of unspecified hand, unspecified site, initial encounter: Secondary | ICD-10-CM | POA: Diagnosis not present

## 2022-07-03 DIAGNOSIS — R21 Rash and other nonspecific skin eruption: Secondary | ICD-10-CM | POA: Diagnosis not present

## 2022-07-14 DIAGNOSIS — H5203 Hypermetropia, bilateral: Secondary | ICD-10-CM | POA: Diagnosis not present

## 2022-07-14 DIAGNOSIS — D3132 Benign neoplasm of left choroid: Secondary | ICD-10-CM | POA: Diagnosis not present

## 2022-07-16 ENCOUNTER — Ambulatory Visit (INDEPENDENT_AMBULATORY_CARE_PROVIDER_SITE_OTHER): Payer: BC Managed Care – PPO | Admitting: Family Medicine

## 2022-07-16 DIAGNOSIS — F331 Major depressive disorder, recurrent, moderate: Secondary | ICD-10-CM | POA: Diagnosis not present

## 2022-07-16 DIAGNOSIS — F411 Generalized anxiety disorder: Secondary | ICD-10-CM | POA: Diagnosis not present

## 2022-07-16 DIAGNOSIS — F429 Obsessive-compulsive disorder, unspecified: Secondary | ICD-10-CM | POA: Diagnosis not present

## 2022-07-20 ENCOUNTER — Encounter (INDEPENDENT_AMBULATORY_CARE_PROVIDER_SITE_OTHER): Payer: Self-pay | Admitting: Family Medicine

## 2022-07-20 ENCOUNTER — Ambulatory Visit (INDEPENDENT_AMBULATORY_CARE_PROVIDER_SITE_OTHER): Payer: BC Managed Care – PPO | Admitting: Family Medicine

## 2022-07-20 VITALS — BP 102/69 | HR 77 | Temp 98.3°F | Ht 68.0 in | Wt 220.0 lb

## 2022-07-20 DIAGNOSIS — Z6838 Body mass index (BMI) 38.0-38.9, adult: Secondary | ICD-10-CM | POA: Diagnosis not present

## 2022-07-20 DIAGNOSIS — E669 Obesity, unspecified: Secondary | ICD-10-CM | POA: Diagnosis not present

## 2022-07-20 DIAGNOSIS — E88819 Insulin resistance, unspecified: Secondary | ICD-10-CM

## 2022-07-21 DIAGNOSIS — F411 Generalized anxiety disorder: Secondary | ICD-10-CM | POA: Diagnosis not present

## 2022-07-21 DIAGNOSIS — F429 Obsessive-compulsive disorder, unspecified: Secondary | ICD-10-CM | POA: Diagnosis not present

## 2022-07-21 DIAGNOSIS — F331 Major depressive disorder, recurrent, moderate: Secondary | ICD-10-CM | POA: Diagnosis not present

## 2022-07-21 NOTE — Progress Notes (Signed)
Chief Complaint:   OBESITY Emily Weber is here to discuss her progress with her obesity treatment plan along with follow-up of her obesity related diagnoses. Emily Weber is on the Category 3 Plan and states she is following her eating plan approximately 85% of the time. Emily Weber states she is walking 20-30 minutes 3 times per week.  Today's visit was #: 11 Starting weight: 250 lbs Starting date: 12/17/2021 Today's weight: 220 lbs Today's date: 07/20/2022 Total lbs lost to date: 30 lbs Total lbs lost since last in-office visit: 2  Interim History: Emily Weber normally sees Dr. Jenetta Downer and this is her 1st appointment with me. Has started walking more consistently. Getting a bit bored with chicken. No plans for next few weeks. Looking for easier options for meals. Has 2 baby showers coming up this weekend.  Subjective:   1. Insulin resistance A1c 4.9, insulin 36.8. On Mounjaro 10 mg. Denies GI side effects.  Assessment/Plan:   1. Insulin resistance Repeat labs in 1 month with Dr. Jenetta Downer.  2. Obesity, Current BMI of 38.5 Emily Weber is currently in the action stage of change. As such, her goal is to continue with weight loss efforts. She has agreed to the Category 3 Plan.   Exercise goals: All adults should avoid inactivity. Some physical activity is better than none, and adults who participate in any amount of physical activity gain some health benefits.  Incorporate 10-15 min of resistance training at least 2x's a week.  Behavioral modification strategies: increasing lean protein intake, meal planning and cooking strategies, keeping healthy foods in the home, and planning for success.  Emily Weber has agreed to follow-up with our clinic in 4 weeks. She was informed of the importance of frequent follow-up visits to maximize her success with intensive lifestyle modifications for her multiple health conditions.   Objective:   Blood pressure 102/69, pulse 77, temperature 98.3 F (36.8 C), height 5\' 8"  (1.727 m),  weight 220 lb (99.8 kg), last menstrual period 07/11/2022, SpO2 99 %, unknown if currently breastfeeding. Body mass index is 33.45 kg/m.  General: Cooperative, alert, well developed, in no acute distress. HEENT: Conjunctivae and lids unremarkable. Cardiovascular: Regular rhythm.  Lungs: Normal work of breathing. Neurologic: No focal deficits.   Lab Results  Component Value Date   CREATININE 0.61 04/01/2022   BUN 16 04/01/2022   NA 139 04/01/2022   K 4.5 04/01/2022   CL 106 04/01/2022   CO2 18 (L) 04/01/2022   Lab Results  Component Value Date   ALT 19 04/01/2022   AST 15 04/01/2022   ALKPHOS 48 04/01/2022   BILITOT 0.3 04/01/2022   Lab Results  Component Value Date   HGBA1C 4.9 04/01/2022   HGBA1C 5.1 11/04/2021   Lab Results  Component Value Date   INSULIN 36.8 (H) 04/01/2022   INSULIN 52.5 (H) 12/17/2021   Lab Results  Component Value Date   TSH 0.785 11/04/2021   Lab Results  Component Value Date   CHOL 151 04/01/2022   HDL 38 (L) 04/01/2022   LDLCALC 83 04/01/2022   TRIG 172 (H) 04/01/2022   CHOLHDL 4.0 04/01/2022   Lab Results  Component Value Date   VD25OH 81.7 04/01/2022   VD25OH 26.7 (L) 11/04/2021   Lab Results  Component Value Date   WBC 7.5 11/04/2021   HGB 14.0 11/04/2021   HCT 40.1 11/04/2021   MCV 92 11/04/2021   PLT 243 11/04/2021   No results found for: "IRON", "TIBC", "FERRITIN"  Attestation Statements:  Reviewed by clinician on day of visit: allergies, medications, problem list, medical history, surgical history, family history, social history, and previous encounter notes.  I, Elnora Morrison, RMA am acting as transcriptionist for Coralie Common, MD.  I have reviewed the above documentation for accuracy and completeness, and I agree with the above. - Coralie Common, MD

## 2022-08-17 ENCOUNTER — Telehealth: Payer: Self-pay

## 2022-08-17 NOTE — Patient Outreach (Signed)
  Care Coordination   Initial Visit Note   08/17/2022 Name: Emily Weber MRN: [id number] DOB: 1985-11-21  [name] is a 36 y.o. year old female who sees Street, Sharon Mt, MD for primary care. I spoke with  [name] by phone today.  What matters to the patients health and wellness today?  Placed call to patient today and reviewed [facility] care coordination program. Patient reports she is doing well. Reports recent ( the last 2 days)  heart flutter.  Reports she has planned to send a message to MD. Encouraged patient to message MD. Reviewed caffeine affects on the body however patient denies any changes in diet.  Patient denies interest in care coordination program.    Care Coordination Interventions Activated:  No  Care Coordination Interventions:  No, not indicated   Follow up plan: No further intervention required.   Encounter Outcome:  Pt. Refused   Tomasa Rand, RN, BSN, CEN [facility] [employer] [phone]

## 2022-08-18 DIAGNOSIS — F331 Major depressive disorder, recurrent, moderate: Secondary | ICD-10-CM | POA: Diagnosis not present

## 2022-08-18 DIAGNOSIS — F411 Generalized anxiety disorder: Secondary | ICD-10-CM | POA: Diagnosis not present

## 2022-08-18 DIAGNOSIS — F429 Obsessive-compulsive disorder, unspecified: Secondary | ICD-10-CM | POA: Diagnosis not present

## 2022-08-24 ENCOUNTER — Ambulatory Visit (INDEPENDENT_AMBULATORY_CARE_PROVIDER_SITE_OTHER): Payer: BC Managed Care – PPO | Admitting: Family Medicine

## 2022-08-24 DIAGNOSIS — Z6833 Body mass index (BMI) 33.0-33.9, adult: Secondary | ICD-10-CM | POA: Diagnosis not present

## 2022-08-24 DIAGNOSIS — I4729 Other ventricular tachycardia: Secondary | ICD-10-CM | POA: Diagnosis not present

## 2022-08-24 DIAGNOSIS — R002 Palpitations: Secondary | ICD-10-CM | POA: Diagnosis not present

## 2022-08-24 DIAGNOSIS — E669 Obesity, unspecified: Secondary | ICD-10-CM | POA: Diagnosis not present

## 2022-08-27 ENCOUNTER — Ambulatory Visit (INDEPENDENT_AMBULATORY_CARE_PROVIDER_SITE_OTHER): Payer: BC Managed Care – PPO | Admitting: Family Medicine

## 2022-09-14 ENCOUNTER — Encounter: Payer: Self-pay | Admitting: Physician Assistant

## 2022-09-14 DIAGNOSIS — F429 Obsessive-compulsive disorder, unspecified: Secondary | ICD-10-CM | POA: Diagnosis not present

## 2022-09-14 DIAGNOSIS — F331 Major depressive disorder, recurrent, moderate: Secondary | ICD-10-CM | POA: Diagnosis not present

## 2022-09-14 DIAGNOSIS — F411 Generalized anxiety disorder: Secondary | ICD-10-CM | POA: Diagnosis not present

## 2022-09-14 NOTE — Progress Notes (Unsigned)
Cardiology Office Note    Date:  09/16/2022   ID:  ZEVA LEBER, DOB April 23, 1986, MRN [id number]  PCP:  Street, Stephanie Coup, MD  Cardiologist:  Garwin Brothers, MD  Electrophysiologist:  None   Chief Complaint: f/u PVCs, NSVT  History of Present Illness:   Emily Weber is a 36 y.o. female with history of HTN, brief NSVT and rare PVCs by 2022 monitor, LVH by echo in 2022, obesity, tobacco abuse, depression, DM, dyslipidemia (followed by PCP), insulin resistance, PCOS who is seen for follow-up. She was previously evaluated for palpitations with 03/2021 Zio with HR range 48-164bpm, avg 785bpm, 1 run NSVT lasting 5 beats, rare PVCs/PACs. Echo for murmur 06/2021 showed EF 60-65%, moderate LVH. Stress echo 07/2021 was negative for ischemia.  She is seen for follow-up today overall doing well. She does report about 3 weeks ago she had a breakthrough episode of sporadic palpitations over the course of 2 weeks similar to prior palpitations, described as a brief harder heartbeat. No tachypalpitations reported. She had some mild SOB with this. The symptoms resolved without intervention. She does not report any angina. Her baseline BP is now on the low end of normal which is stable for her. She does report she has a history of HTN in the past, weight as high as 302 previously, with mild OSA not requiring treatment. Her weight has since declined from this level though she notes that her BP was previously not as low as it is now even at 200lb. Her grandfather had a heart attack but no other family hx of heart disease. She has thyroid enlargement on exam. She previously had Cytomel listed on her med list has not been taking for several months - denies hx of thyroid problems but reports she was started on this by an integrative medicine doctor a while back who told her to take it at 3am every day which she found difficult to keep up with.   Labwork independently reviewed: 03/2022 LDL 83, trig 172 (PCP), K  4.5, Cr 0.61, LFTs ok, A1c 4.9 12/2021 TSH wnl 10/2021 CBC wnl  Cardiology Studies:   Studies reviewed are outlined and summarized above. Reports included below if pertinent.   Scanned 07/2021 stress echo normal  2D echo 06/2021  1. Left ventricular ejection fraction, by estimation, is 60 to 65%. The  left ventricle has normal function. The left ventricle has no regional  wall motion abnormalities. There is moderate concentric left ventricular  hypertrophy. Left ventricular  diastolic parameters were normal.   2. Right ventricular systolic function is normal. The right ventricular  size is normal. There is normal pulmonary artery systolic pressure.   3. The mitral valve is normal in structure. No evidence of mitral valve  regurgitation. No evidence of mitral stenosis.   4. The aortic valve is tricuspid. Aortic valve regurgitation is not  visualized. No aortic stenosis is present.   5. The inferior vena cava is normal in size with greater than 50%  respiratory variability, suggesting right atrial pressure of 3 mmHg.   03/2021 Scanned zio - 48-164bpm, avg 78bpm, 1 run NSVT lasting 5 beats coinciding with sx, rare PVCs/PACs    Past Medical History:  Diagnosis Date   [redacted] weeks gestation of pregnancy    Abnormal MSAFP (maternal serum alpha-fetoprotein), elevated 01/14/2018   4.06 MoM   Anxiety    Back pain    Blood glucose abnormal 07/22/2016   Cardiac murmur 06/10/2021   Chest pain of  uncertain etiology [id number]   Cigarette smoker 06/10/2021   Depression    Diabetes mellitus ([place]) 07/01/2017   Diabetes mellitus type 2 in obese ([place]) 08/03/2017   HgbA1C: 5.7 at NO[facility]   Dyslipidemia    Elevated AFP 12/26/2017   Spina Bifida risk 1:10 Referred to MFM for level II u/s and counseling.   Gallbladder problem    GERD (gastroesophageal reflux disease)    History of cesarean section 10/27/2017   Desires TOL, op note requested   Hypertension 07/01/2017   Hypertension, benign     Insulin resistance 07/26/2017   Irregular periods 08/21/2016   LVH (left ventricular hypertrophy)    Mild obstructive sleep apnea    Morbid obesity ([place]) 06/10/2021   Morbid obesity with BMI of 40.0-44.9, adult ([place]) 08/21/2016   Nonsustained ventricular tachycardia ([place]) 06/10/2021   NSVT (nonsustained ventricular tachycardia) (H[facility])    Palpitations    Panic attack    PCOS (polycystic ovarian syndrome) 07/26/2017   Post-cholecystectomy syndrome    chronic loose stools   Preeclampsia, third trimester 04/06/2014   Pregnancy induced hypertension    PVC's (premature ventricular contractions)    Pyelonephritis    Reflux gastritis    Renal calculi 07/01/2017   Smoker 08/21/2016   Vitamin D deficiency     Past Surgical History:  Procedure Laterality Date   ADENOIDECTOMY     CESAREAN SECTION  04/2014   CHOLECYSTECTOMY  04/2015   DILATION AND CURETTAGE OF UTERUS  2015   KIDNEY STONE SURGERY     LITHOTRIPSY     TONSILLECTOMY     URETERAL STENT PLACEMENT Bilateral 07/02/2017   Dr. Luciano Cutter at [facility]  01/2020   Dr. Lilia Pro    Current Medications: Current Meds  Medication Sig   Acetaminophen (TYLENOL PO) Take by mouth.   ALPRAZolam (XANAX) 0.25 MG tablet Take 0.25 mg by mouth daily as needed for anxiety.   FLUoxetine (PROZAC) 20 MG capsule Take 40 mg by mouth daily.   IBUPROFEN PO Take 200 mg by mouth daily as needed (pain).   MOUNJARO 12.5 MG/0.5ML Pen Inject 12.5 mg into the skin once a week.   pantoprazole (PROTONIX) 40 MG tablet Take 40 mg by mouth 2 (two) times daily.   progesterone (PROMETRIUM) 200 MG capsule Take 400 mg by mouth daily.   VITAMIN D-VITAMIN K PO Take 1 tablet by mouth daily at 12 noon.   [DISCONTINUED] liothyronine (CYTOMEL) 5 MCG tablet Take 10 mcg by mouth daily. No longer takes      Allergies:   Patient has no known allergies.   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  Tobacco Use   Smoking status: Every Day    Types: Cigarettes    Last attempt to quit: 2019    Years since quitting: 4.9   Smokeless tobacco: Never  Substance and Sexual Activity   Alcohol use: No   Drug use: No   Sexual activity: Not on file  Other Topics Concern   Not on file  Social History Narrative   Not on file   Social Determinants of Health   Financial Resource Strain: Not on file  Food Insecurity: Not on file  Transportation Needs: Not  on file  Physical Activity: Not on file  Stress: Not on file  Social Connections: Not on file     Family History:  The patient's family history includes Anxiety disorder in her mother; Cancer in her mother; Depression in her mother; Diabetes in her maternal grandfather, mother, and paternal grandfather; Hyperlipidemia in her mother; Hypertension in her mother; Obesity in her mother; Skin cancer in her mother; Sleep apnea in her mother.  ROS:   Please see the history of present illness. All other systems are reviewed and otherwise negative.    EKG(s)/Additional Labs   EKG:  EKG is ordered today, personally reviewed, demonstrating NSR 78bpm, lower voltage QRS, no acute STT changes  Recent Labs: 11/04/2021: Hemoglobin 14.0; Platelets 243; TSH 0.785 04/01/2022: ALT 19; BUN 16; Creatinine, Ser 0.61; Potassium 4.5; Sodium 139  Recent Lipid Panel    Component Value Date/Time   CHOL 151 04/01/2022 1253   TRIG 172 (H) 04/01/2022 1253   HDL 38 (L) 04/01/2022 1253   CHOLHDL 4.0 04/01/2022 1253   LDLCALC 83 04/01/2022 1253    PHYSICAL EXAM:    VS:  BP 100/78 (BP Location: Left Arm, Patient Position: Sitting, Cuff Size: Large)   Pulse 78   Ht 5\' 8"  (1.727 m)   Wt 225 lb (102.1 kg)   SpO2 97%   BMI 34.21 kg/m   BMI: Body mass index is 34.21 kg/m.  GEN: Well  nourished, well developed female in no acute distress HEENT: normocephalic, atraumatic Neck: no JVD, carotid bruits. + bilateral thyroid enlargement noted Cardiac: RRR; no murmurs, rubs, or gallops, no edema  Respiratory:  clear to auscultation bilaterally, normal work of breathing GI: soft, nontender, nondistended, + BS MS: no deformity or atrophy Skin: warm and dry, no rash Neuro:  Alert and Oriented x 3, Strength and sensation are intact, follows commands Psych: euthymic mood, full affect  Wt Readings from Last 3 Encounters:  09/16/22 225 lb (102.1 kg)  07/20/22 220 lb (99.8 kg)  06/18/22 222 lb (100.7 kg)     ASSESSMENT & PLAN:   1. Essential HTN - this has not been an issue recently at current weight. I wonder if the OSA she had at higher weight was contributing to HTN that has since resolved. It did not require treatment at that time. We will update labs and follow.  2. NSVT, rare PVCs - no large burden by prior monitor. She had breakthrough episode 3 weeks ago without clear trigger that resolved without intervention. Will update basic labs/lytes today and go ahead and give her an rx for low dose metoprolol 12.5mg  daily as needed for palpitations. Her BP is otherwise too soft for preventative treatment at this time and she otherwise does not feel symptoms happen often enough to warrant rx. If this becomes an issue going forward, recommend EP eval to consider additional options.  3. LVH - unclear etiology, ? Previously due to HTN. Will repeat echo now that BP has been perpetually normal. If LVH is still notable, would consider cardiac MRI.  4. Tobacco abuse - discussed cessation; she is working on it - cut down to 1/2 ppd and promised her 37 y/o she would quit.  5. Thyroid enlargement - check TFTs today and get thyroid US.     Disposition: F/u with me in 3 months.   Medication Adjustments/Labs and Tests Ordered: Current medicines are reviewed at length with the patient today.   Concerns regarding medicines are outlined above. Medication changes, Labs and Tests ordered  today are summarized above and listed in the Patient Instructions accessible in Encounters.   Signed, Charlie Pitter, PA-C  09/16/2022 8:56 AM    [place] Phone: [phone]; Fax: [phone]

## 2022-09-15 DIAGNOSIS — F429 Obsessive-compulsive disorder, unspecified: Secondary | ICD-10-CM | POA: Diagnosis not present

## 2022-09-15 DIAGNOSIS — F331 Major depressive disorder, recurrent, moderate: Secondary | ICD-10-CM | POA: Diagnosis not present

## 2022-09-15 DIAGNOSIS — F411 Generalized anxiety disorder: Secondary | ICD-10-CM | POA: Diagnosis not present

## 2022-09-16 ENCOUNTER — Encounter: Payer: Self-pay | Admitting: Physician Assistant

## 2022-09-16 ENCOUNTER — Ambulatory Visit: Payer: BC Managed Care – PPO | Attending: Physician Assistant | Admitting: Physician Assistant

## 2022-09-16 VITALS — BP 100/78 | HR 78 | Ht 68.0 in | Wt 225.0 lb

## 2022-09-16 DIAGNOSIS — I493 Ventricular premature depolarization: Secondary | ICD-10-CM

## 2022-09-16 DIAGNOSIS — E049 Nontoxic goiter, unspecified: Secondary | ICD-10-CM

## 2022-09-16 DIAGNOSIS — I10 Essential (primary) hypertension: Secondary | ICD-10-CM

## 2022-09-16 DIAGNOSIS — I517 Cardiomegaly: Secondary | ICD-10-CM

## 2022-09-16 DIAGNOSIS — I4729 Other ventricular tachycardia: Secondary | ICD-10-CM | POA: Diagnosis not present

## 2022-09-16 DIAGNOSIS — Z720 Tobacco use: Secondary | ICD-10-CM

## 2022-09-16 MED ORDER — METOPROLOL TARTRATE 25 MG PO TABS: 12.5000 mg | ORAL_TABLET | Freq: Every day | ORAL | 5 refills | Status: DC | PRN

## 2022-09-16 NOTE — Patient Instructions (Addendum)
Medication Instructions:   START TAKING: METOPROLOL 12.5 MG AS NEEDED FOR PALPITATIONS  *If you need a refill on your cardiac medications before your next appointment, please call your pharmacy*   Lab Work:  TODAY:  BMET MAG CBC TSH FREE T4 TOTAL T3   If you have labs (blood work) drawn today and your tests are completely normal, you will receive your results only by: MyChart Message (if you have MyChart) OR A paper copy in the mail If you have any lab test that is abnormal or we need to change your treatment, we will call you to review the results.   Testing/Procedures: Your physician has requested that you have an echocardiogram. Echocardiography is a painless test that uses sound waves to create images of your heart. It provides your doctor with information about the size and shape of your heart and how well your heart's chambers and valves are working. This procedure takes approximately one hour. There are no restrictions for this procedure. Please do NOT wear cologne, perfume, aftershave, or lotions (deodorant is allowed). Please arrive 15 minutes prior to your appointment time.\   Your physician has requested that you have an :Thyroid ultrasound is a sound wave picture of the thyroid gland taken by a hand-held instrument and translated to a 2-dimensional picture on a monitor. It is used in diagnosis of tumors, cysts or goiters of the thyroid, and is a painless, no-risk procedure.    Follow-Up: At [facility], you and your health needs are our priority.  As part of our continuing mission to provide you with exceptional heart care, we have created designated Provider Care Teams.  These Care Teams include your primary Cardiologist (physician) and Advanced Practice Providers (APPs -  Physician Assistants and Nurse Practitioners) who all work together to provide you with the care you need, when you need it.  We recommend signing up for the patient portal called "MyChart".  Sign  up information is provided on this After Visit Summary.  MyChart is used to connect with patients for Virtual Visits (Telemedicine).  Patients are able to view lab/test results, encounter notes, upcoming appointments, etc.  Non-urgent messages can be sent to your provider as well.   To learn more about what you can do with MyChart, go to [email or web address].    Your next appointment:   3 month(s)  The format for your next appointment:   In Person  Provider:   Ronie Spies, PA-C       Other Instructions   Important Information About Sugar

## 2022-09-17 ENCOUNTER — Telehealth: Payer: Self-pay

## 2022-09-17 LAB — CBC
Hematocrit: 44.1 % (ref 34.0–46.6)
Hemoglobin: 14.4 g/dL (ref 11.1–15.9)
MCH: 30.4 pg (ref 26.6–33.0)
MCHC: 32.7 g/dL (ref 31.5–35.7)
MCV: 93 fL (ref 79–97)
Platelets: 272 10*3/uL (ref 150–450)
RBC: 4.73 x10E6/uL (ref 3.77–5.28)
RDW: 11.5 % — ABNORMAL LOW (ref 11.7–15.4)
WBC: 9.1 10*3/uL (ref 3.4–10.8)

## 2022-09-17 LAB — BASIC METABOLIC PANEL
BUN/Creatinine Ratio: 23 (ref 9–23)
BUN: 14 mg/dL (ref 6–20)
CO2: 25 mmol/L (ref 20–29)
Calcium: 9.7 mg/dL (ref 8.7–10.2)
Chloride: 102 mmol/L (ref 96–106)
Creatinine, Ser: 0.61 mg/dL (ref 0.57–1.00)
Glucose: 78 mg/dL (ref 70–99)
Potassium: 4.6 mmol/L (ref 3.5–5.2)
Sodium: 139 mmol/L (ref 134–144)
eGFR: 120 mL/min/{1.73_m2} (ref >59)

## 2022-09-17 LAB — MAGNESIUM: Magnesium: 1.9 mg/dL (ref 1.6–2.3)

## 2022-09-17 LAB — T4, FREE: Free T4: 1.22 ng/dL (ref 0.82–1.77)

## 2022-09-17 LAB — TSH: TSH: 1.03 u[IU]/mL (ref 0.450–4.500)

## 2022-09-17 LAB — T3: T3, Total: 112 ng/dL (ref 71–180)

## 2022-09-17 NOTE — Telephone Encounter (Signed)
The patient has been notified of the result and verbalized understanding.  All questions (if any) were answered. Frutoso Schatz, RN 09/17/2022 12:13 PM   Patient is already taking magnesium glycinate 400 mg. Will update her med list and make Dayna aware

## 2022-09-17 NOTE — Telephone Encounter (Signed)
-----   Message from Laurann Montana, [place] sent at 09/17/2022  8:38 AM EST ----- Please let pt know labs are stable except Mg level just slightly below what we'd like it to be for history of palpitations. Would add MagOx 400mg  daily or Magnesium Glycinate 200mg  daily (both available OTC, can provide rx MagOx if she has trouble finding) then recheck magnesium level when she comes in for echo. Mag glycinate has better absorption than MagOx but MagOx is cheaper. If any h/o GI upset/diarrhea, Mag Glycinate is the better of the two. Thank you

## 2022-09-23 ENCOUNTER — Ambulatory Visit (HOSPITAL_COMMUNITY)
Admission: RE | Admit: 2022-09-23 | Discharge: 2022-09-23 | Disposition: A | Discharge status: Home / Self Care | Payer: BC Managed Care – PPO | Source: Ambulatory Visit | Attending: Physician Assistant | Admitting: Physician Assistant

## 2022-09-23 DIAGNOSIS — E049 Nontoxic goiter, unspecified: Secondary | ICD-10-CM | POA: Diagnosis not present

## 2022-09-23 DIAGNOSIS — E041 Nontoxic single thyroid nodule: Secondary | ICD-10-CM | POA: Diagnosis not present

## 2022-09-28 DIAGNOSIS — F429 Obsessive-compulsive disorder, unspecified: Secondary | ICD-10-CM | POA: Diagnosis not present

## 2022-09-28 DIAGNOSIS — F331 Major depressive disorder, recurrent, moderate: Secondary | ICD-10-CM | POA: Diagnosis not present

## 2022-09-28 DIAGNOSIS — F411 Generalized anxiety disorder: Secondary | ICD-10-CM | POA: Diagnosis not present

## 2022-10-09 DIAGNOSIS — M791 Myalgia, unspecified site: Secondary | ICD-10-CM | POA: Diagnosis not present

## 2022-10-15 ENCOUNTER — Ambulatory Visit (HOSPITAL_COMMUNITY): Payer: BC Managed Care – PPO | Attending: Physician Assistant

## 2022-10-15 DIAGNOSIS — F429 Obsessive-compulsive disorder, unspecified: Secondary | ICD-10-CM | POA: Diagnosis not present

## 2022-10-15 DIAGNOSIS — I517 Cardiomegaly: Secondary | ICD-10-CM

## 2022-10-15 DIAGNOSIS — F331 Major depressive disorder, recurrent, moderate: Secondary | ICD-10-CM | POA: Diagnosis not present

## 2022-10-15 DIAGNOSIS — F411 Generalized anxiety disorder: Secondary | ICD-10-CM | POA: Diagnosis not present

## 2022-10-15 LAB — ECHOCARDIOGRAM COMPLETE
Area-P 1/2: 3.45 cm2
S' Lateral: 2.65 cm

## 2022-10-29 DIAGNOSIS — F411 Generalized anxiety disorder: Secondary | ICD-10-CM | POA: Diagnosis not present

## 2022-10-29 DIAGNOSIS — F331 Major depressive disorder, recurrent, moderate: Secondary | ICD-10-CM | POA: Diagnosis not present

## 2022-10-29 DIAGNOSIS — F429 Obsessive-compulsive disorder, unspecified: Secondary | ICD-10-CM | POA: Diagnosis not present

## 2022-11-23 DIAGNOSIS — J3489 Other specified disorders of nose and nasal sinuses: Secondary | ICD-10-CM | POA: Diagnosis not present

## 2022-11-30 ENCOUNTER — Other Ambulatory Visit: Payer: Self-pay

## 2022-11-30 DIAGNOSIS — I493 Ventricular premature depolarization: Secondary | ICD-10-CM | POA: Insufficient documentation

## 2022-11-30 DIAGNOSIS — M549 Dorsalgia, unspecified: Secondary | ICD-10-CM | POA: Insufficient documentation

## 2022-11-30 DIAGNOSIS — R002 Palpitations: Secondary | ICD-10-CM | POA: Insufficient documentation

## 2022-11-30 DIAGNOSIS — I517 Cardiomegaly: Secondary | ICD-10-CM | POA: Insufficient documentation

## 2022-11-30 DIAGNOSIS — K829 Disease of gallbladder, unspecified: Secondary | ICD-10-CM | POA: Insufficient documentation

## 2022-11-30 DIAGNOSIS — K219 Gastro-esophageal reflux disease without esophagitis: Secondary | ICD-10-CM | POA: Insufficient documentation

## 2022-11-30 DIAGNOSIS — F419 Anxiety disorder, unspecified: Secondary | ICD-10-CM | POA: Insufficient documentation

## 2022-12-01 ENCOUNTER — Ambulatory Visit: Payer: BC Managed Care – PPO | Attending: Cardiology | Admitting: Cardiology

## 2022-12-10 ENCOUNTER — Encounter: Payer: Self-pay | Admitting: Cardiology

## 2022-12-10 DIAGNOSIS — F429 Obsessive-compulsive disorder, unspecified: Secondary | ICD-10-CM | POA: Diagnosis not present

## 2022-12-10 DIAGNOSIS — F331 Major depressive disorder, recurrent, moderate: Secondary | ICD-10-CM | POA: Diagnosis not present

## 2022-12-10 DIAGNOSIS — F411 Generalized anxiety disorder: Secondary | ICD-10-CM | POA: Diagnosis not present

## 2022-12-23 DIAGNOSIS — F422 Mixed obsessional thoughts and acts: Secondary | ICD-10-CM | POA: Diagnosis not present

## 2022-12-25 DIAGNOSIS — F422 Mixed obsessional thoughts and acts: Secondary | ICD-10-CM | POA: Diagnosis not present

## 2023-01-12 DIAGNOSIS — F422 Mixed obsessional thoughts and acts: Secondary | ICD-10-CM | POA: Diagnosis not present

## 2023-01-21 DIAGNOSIS — M5442 Lumbago with sciatica, left side: Secondary | ICD-10-CM | POA: Diagnosis not present

## 2023-01-21 DIAGNOSIS — Z6835 Body mass index (BMI) 35.0-35.9, adult: Secondary | ICD-10-CM | POA: Diagnosis not present

## 2023-01-21 DIAGNOSIS — F422 Mixed obsessional thoughts and acts: Secondary | ICD-10-CM | POA: Diagnosis not present

## 2023-02-02 DIAGNOSIS — F422 Mixed obsessional thoughts and acts: Secondary | ICD-10-CM | POA: Diagnosis not present

## 2023-02-04 DIAGNOSIS — E669 Obesity, unspecified: Secondary | ICD-10-CM

## 2023-02-04 HISTORY — DX: Obesity, unspecified: E66.9

## 2023-02-04 NOTE — Progress Notes (Addendum)
TeleHealth Visit:  This visit was completed with telemedicine (audio/video) technology. [name] has verbally consented to this TeleHealth visit. The patient is located at home, the provider is located at home. The participants in this visit include the listed provider and patient. The visit was conducted today via MyChart video.  OBESITY Emily Weber is here to discuss her progress with her obesity treatment plan along with follow-up of her obesity related diagnoses.   Today's visit was # 12 Starting weight: 250 lbs Starting date: 12/17/2021 Weight at last in office visit: 220 lbs on 07/20/22 Total weight loss: 30 lbs at last in office visit on 07/20/22. Today's reported weight (02/08/23):  238 lbs  Nutrition Plan: none  Current exercise:  walking at ball field.  Interim History:   Her 11 and 35 yo boys are in baseball.  Her family tends to eat out often because of this.  They are trying to eat out at sitdown places where there are healthier options.  They tend to eat out after the game or practice-730 or 8 PM.  They are generally at the ball field all weekend long.  She does not cook a lot at home but wants to work on this.  Eats dinner out at 7:30 - 8 pm.   She is sometimes tempted by food at the ball field-they have food trucks.  She is trying to keep healthier foods at home.  Her older son has weight issues.  She is currently prescribed Ozempic 1 mg through an integrative medicine nurse practitioner-Tosha Susanne Greenhouse, FNP-C [facility], [phone]).  She does not feel it is helping much with appetite.  Previous on Mounjaro 12.5 mg.  She feels that the Ozempic is helping better than the Mounjaro did.  Eating all of the prescribed protein: no Skipping meals: No Drinking adequate water: Yes  Drinking sugar sweetened beverages: No  Pharmacotherapy: [name] is on Ozempic 1 mg SQ weekly Adverse side effects: None Hunger is poorly controlled.  Cravings are poorly  controlled.  Assessment/Plan:  1. Type 2 Diabetes Mellitus with other specified complication, without long-term current use of insulin HgbA1c is at goal. Last A1c was 4.9 Medication(s): Ozempic 1 mg SQ weekly Does not note a lot of appetite suppression from the Ozempic.  Lab Results  Component Value Date   HGBA1C 4.9 04/01/2022   HGBA1C 5.1 11/04/2021   Lab Results  Component Value Date   LDLCALC 83 04/01/2022   CREATININE 0.61 09/16/2022   No results found for: "GFR"  Plan: Continue Ozempic 1 mg weekly.  Will assess need for dose increase next visit. Will send for recent lab work from [employer]' office.  2. GERD Has heartburn.  Taking pantoprazole sporadically.  Likely worsened by Ozempic.  Plan: Take pantoprazole 40 mg daily rather than as needed.  3. OCD Has had recent exacerbation of OCD.  Reports obsessive thoughts rather than actions.  Following up with psychiatry.  Taking Prozac 20 mg daily.  Plan: Continue Prozac 20 mg daily as prescribed by psychiatry. Follow-up with psychiatry as directed.  Morbid Obesity: Current BMI 36 Syriana is currently in the action stage of change. As such, her goal is to continue with weight loss efforts.  She has agreed to the Category 3 plan and keeping a food journal with goal of 1500-1600 calories and 100 grams of protein daily.  1.  Encouraged her to try to cook at least 2 dinners per week with enough for leftovers. 2.  Encouraged her to have a  heavy protein snack before going to the ball field.  Exercise goals: Walk for 30 minutes 3 times weekly.  Behavioral modification strategies: increasing lean protein intake, decreasing simple carbohydrates , decrease eating out, meal planning , and planning for success.  [name] has agreed to follow-up with our clinic in 4 weeks.   No orders of the defined types were placed in this encounter.   There are no discontinued medications.   No orders of the defined types were placed in  this encounter.     Objective:   VITALS: Per patient if applicable, see vitals. GENERAL: Alert and in no acute distress. CARDIOPULMONARY: No increased WOB. Speaking in clear sentences.  PSYCH: Pleasant and cooperative. Speech normal rate and rhythm. Affect is appropriate. Insight and judgement are appropriate. Attention is focused, linear, and appropriate.  NEURO: Oriented as arrived to appointment on time with no prompting.   Attestation Statements:   Reviewed by clinician on day of visit: allergies, medications, problem list, medical history, surgical history, family history, social history, and previous encounter notes.  Time spent on visit including the items listed below was 30 minutes.  -preparing to see the patient (e.g., review of tests, history, previous notes) -obtaining and/or reviewing separately obtained history -counseling and educating the patient/family/caregiver -documenting clinical information in the electronic or other health record -ordering medications, tests, or procedures -independently interpreting results and communicating results to the patient/ family/caregiver -referring and communicating with other health care professionals  -care coordination   This was prepared with the assistance of Engineer, civil (consulting).  Occasional wrong-word or sound-a-like substitutions may have occurred due to the inherent limitations of voice recognition software.

## 2023-02-07 NOTE — Progress Notes (Unsigned)
Cardiology Office Note:    Date:  02/09/2023   ID:  Emily Weber, DOB April 08, 1986, MRN [id number]  PCP:  Street, Stephanie Coup, MD   [facility] Providers Cardiologist:  Garwin Brothers, MD {   Referring MD: Street, Stephanie Coup, *   Chief Complaint  Patient presents with   Palpitations    History of Present Illness:    Emily Weber is a 37 y.o. female with a hx of hypertension, brief NSVT and rare PVCs on monitor in 2022, LVH by echo in 2022, obesity, tobacco use, depression, DM, dyslipidemia, insulin resistance, PCOS.  Patient is followed by Dr. Tomie China and presents today for a 4 month follow up.   Patient previously wore a cardiac monitor for evaluation of palpitations in 03/2021 that showed heart rate ranging from 48-146 bpm with an average of 75 bpm, 1 episode of NSVT lasting 5 beats, rare PVCs/PACs. She had an echocardiogram in 06/2021 for evaluation of heart murmur that showed EF 60-65%, no regional wall motion abnormalities, moderate LVH, normal RV systolic function. Stress echo 07/2021 showed no evidence of inducible ischemia   Patient was last seen by cardiology on 09/16/22. At that time, she did have some breakthrough palpitations. She noted that she previously weighed 300 lbs, and had lost about 100 lbs. Since her weight loss, her BP had been better controlled. She had an echocardiogram on 10/15/22 that showed EF 65-70%, no regional wall motion abnormalities, normal diastolic parameters.  Patient had a notably enlarged thyroid, and patient underwent thyroid ultrasound on 09/23/2022 that showed a 1.9 cm right infrahilar thyroid TR 3 nodule that meets criteria for 1 year follow-up.  Today, patient reports that she has been having increased episodes of palpitations for the past 3 days.  Palpitations seem to be triggered by exertion, last as long as she is exerting herself.  If she rests, palpitations resolve in about 5-10 minutes.  Her palpitations feel like "her heart  skips a beat", and seem to take the breath out of her.  She denies chest pain, shortness of breath.  Occasionally she will get lightheaded when she goes from sitting/squatting to standing.  Denies syncope, near syncope  Past Medical History:  Diagnosis Date   [redacted] weeks gestation of pregnancy    Abnormal MSAFP (maternal serum alpha-fetoprotein), elevated 01/14/2018   4.06 MoM   Anxiety    At risk for hypoglycemia 04/06/2022   B12 deficiency 04/06/2022   Back pain    Blood glucose abnormal 07/22/2016   Cardiac murmur 06/10/2021   Chest pain of uncertain etiology 06/10/2021   Cigarette smoker 06/10/2021   Depression    Diabetes mellitus (HCC) 07/01/2017   Diabetes mellitus type 2 in obese 08/03/2017   HgbA1C: 5.7 at NOB   Dyslipidemia    Elevated AFP 12/26/2017   Spina Bifida risk 1:10 Referred to MFM for level II u/s and counseling.   Gallbladder problem    GERD (gastroesophageal reflux disease)    History of cesarean section 10/27/2017   Desires TOL, op note requested   Hyperlipidemia associated with type 2 diabetes mellitus (HCC) mixed 01/22/2022   Hypertension 07/01/2017   Hypertension, benign    Hypertriglyceridemia 06/18/2022   Insulin resistance 07/26/2017   Irregular periods 08/21/2016   LVH (left ventricular hypertrophy)    Mild obstructive sleep apnea    Morbid obesity (HCC) 06/10/2021   Morbid obesity with BMI of 40.0-44.9, adult (HCC) 08/21/2016   Nonsustained ventricular tachycardia (HCC) 06/10/2021   Palpitations  Panic attack    PCOS (polycystic ovarian syndrome) 07/26/2017   Post-cholecystectomy syndrome    chronic loose stools   Preeclampsia, third trimester 04/06/2014   Pregnancy induced hypertension    PVC's (premature ventricular contractions)    Pyelonephritis    Reflux gastritis    Renal calculi 07/01/2017   Smoker 08/21/2016   Vitamin D deficiency     Past Surgical History:  Procedure Laterality Date   ADENOIDECTOMY     CESAREAN SECTION   04/2014   CHOLECYSTECTOMY  04/2015   DILATION AND CURETTAGE OF UTERUS  2015   KIDNEY STONE SURGERY     LITHOTRIPSY     TONSILLECTOMY     URETERAL STENT PLACEMENT Bilateral 07/02/2017   Dr. Nicanor Bake at [facility]   VENTRAL HERNIA REPAIR  01/2020   Dr. Lequita Halt    Current Medications: Current Meds  Medication Sig   ALPRAZolam (XANAX) 0.25 MG tablet Take 0.25 mg by mouth daily as needed for anxiety.   celecoxib (CELEBREX) 200 MG capsule Take 200 mg by mouth 2 (two) times daily as needed for mild pain. back   FLUoxetine (PROZAC) 20 MG capsule Take 40 mg by mouth daily.   fluticasone (FLONASE) 50 MCG/ACT nasal spray Place 1 spray into both nostrils as needed for allergies.   IBUPROFEN PO Take 200 mg by mouth daily as needed (pain).   MAGNESIUM GLYCINATE PO Take 100 mg by mouth daily.   metoprolol tartrate (LOPRESSOR) 25 MG tablet Take 0.5 tablets (12.5 mg total) by mouth daily as needed (Palpitations).   pantoprazole (PROTONIX) 40 MG tablet Take 40 mg by mouth 2 (two) times daily.   progesterone (PROMETRIUM) 200 MG capsule Take 400 mg by mouth daily.   Semaglutide, 1 MG/DOSE, (OZEMPIC, 1 MG/DOSE,) 2 MG/1.5ML SOPN Inject 1 mg into the skin once a week.   VITAMIN D-VITAMIN K PO Take 1 tablet by mouth daily at 12 noon.     Allergies:   Patient has no known allergies.   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  Tobacco Use   Smoking status: Every Day    Types: Cigarettes    Last attempt to quit: 2019    Years since quitting: 5.3   Smokeless tobacco: Never  Substance and Sexual Activity   Alcohol use: No   Drug use: No   Sexual activity: Not on file  Other Topics Concern   Not on file  Social History Narrative   Not on file   Social Determinants of Health    Financial Resource Strain: Not on file  Food Insecurity: Not on file  Transportation Needs: Not on file  Physical Activity: Not on file  Stress: Not on file  Social Connections: Not on file     Family History: The patient's family history includes Anxiety disorder in her mother; Cancer in her mother; Depression in her mother; Diabetes in her maternal grandfather, mother, and paternal grandfather; Hyperlipidemia in her mother; Hypertension in her mother; Obesity in her mother; Skin cancer in her mother; Sleep apnea in her mother.  ROS:   Please see the history of present illness.     All other systems reviewed and are negative.  EKGs/Labs/Other Studies Reviewed:    The following  studies were reviewed today: Cardiac Studies & Procedures       ECHOCARDIOGRAM  ECHOCARDIOGRAM COMPLETE 10/15/2022  Narrative ECHOCARDIOGRAM REPORT    Patient Name:   ONIYAH ROHE Date of Exam: 10/15/2022 Medical Rec #:  [id number]      Height:       68.0 in Accession #:    [id number]     Weight:       225.0 lb Date of Birth:  1985-11-30     BSA:          2.149 m Patient Age:    36 years       BP:           100/78 mmHg Patient Gender: F              HR:           71 bpm. Exam Location:  [address]  Procedure: 2D Echo, 3D Echo, Cardiac Doppler and Color Doppler  Indications:    I51.7 Left Ventricular Hypertrophy  History:        Patient has prior history of Echocardiogram examinations, most recent 06/26/2021. Arrythmias:PVC and NSVT, Signs/Symptoms:Murmur and Chest Pain; Risk Factors:Family History of Coronary Artery Disease, Hypertension, Diabetes, Dyslipidemia, Current Smoker and Sleep Apnea. Palpitaitons, Obesity.  Sonographer:    Farrel Conners RDCS Referring Phys: Laurann Montana  IMPRESSIONS   1. Left ventricular ejection fraction, by estimation, is 65 to 70%. The left ventricle has normal function. The left ventricle has no regional wall motion abnormalities. Left ventricular  diastolic parameters were normal. 2. Right ventricular systolic function is normal. The right ventricular size is normal. Tricuspid regurgitation signal is inadequate for assessing PA pressure. 3. No evidence of mitral valve regurgitation. 4. The aortic valve is grossly normal. Aortic valve regurgitation is not visualized.  Conclusion(s)/Recommendation(s): Normal biventricular function without evidence of hemodynamically significant valvular heart disease.  FINDINGS Left Ventricle: Left ventricular ejection fraction, by estimation, is 65 to 70%. The left ventricle has normal function. The left ventricle has no regional wall motion abnormalities. The left ventricular internal cavity size was normal in size. There is no left ventricular hypertrophy. Left ventricular diastolic parameters were normal.  Right Ventricle: The right ventricular size is normal. Right ventricular systolic function is normal. Tricuspid regurgitation signal is inadequate for assessing PA pressure.  Left Atrium: Left atrial size was normal in size.  Right Atrium: Right atrial size was normal in size.  Pericardium: There is no evidence of pericardial effusion.  Mitral Valve: No evidence of mitral valve regurgitation.  Tricuspid Valve: Tricuspid valve regurgitation is not demonstrated.  Aortic Valve: The aortic valve is grossly normal. Aortic valve regurgitation is not visualized.  Pulmonic Valve: Pulmonic valve regurgitation is not visualized.  Aorta: The aortic root and ascending aorta are structurally normal, with no evidence of dilitation.  IAS/Shunts: No atrial level shunt detected by color flow Doppler.   LEFT VENTRICLE PLAX 2D LVIDd:         4.85 cm   Diastology LVIDs:         2.65 cm   LV e' medial:    8.27 cm/s LV PW:         0.75 cm   LV E/e' medial:  7.2 LV IVS:        0.85 cm   LV e' lateral:   18.20 cm/s LVOT diam:     2.60 cm   LV E/e' lateral: 3.3 LV SV:  99 LV SV Index:   46 LVOT  Area:     5.31 cm  3D Volume EF: 3D EF:        69 % LV EDV:       140 ml LV ESV:       43 ml LV SV:        97 ml  RIGHT VENTRICLE RV Basal diam:  3.50 cm RV S prime:     11.20 cm/s TAPSE (M-mode): 1.4 cm  LEFT ATRIUM             Index        RIGHT ATRIUM           Index LA diam:        4.10 cm 1.91 cm/m   RA Area:     19.90 cm LA Vol (A2C):   40.3 ml 18.76 ml/m  RA Volume:   62.50 ml  29.09 ml/m LA Vol (A4C):   44.2 ml 20.57 ml/m LA Biplane Vol: 42.3 ml 19.69 ml/m AORTIC VALVE LVOT Vmax:   99.53 cm/s LVOT Vmean:  67.133 cm/s LVOT VTI:    0.187 m  AORTA Ao Root diam: 3.50 cm Ao Asc diam:  3.30 cm  MITRAL VALVE MV Area (PHT): cm         SHUNTS MV Decel Time: 220 msec    Systemic VTI:  0.19 m MV E velocity: 59.42 cm/s  Systemic Diam: 2.60 cm MV A velocity: 49.25 cm/s MV E/A ratio:  1.21  Carolan Clines Electronically signed by Carolan Clines Signature Date/Time: 10/15/2022/10:20:12 AM    Final    MONITORS  LONG TERM MONITOR (3-14 DAYS) 06/24/2021             Recent Labs: 04/01/2022: ALT 19 09/16/2022: BUN 14; Creatinine, Ser 0.61; Hemoglobin 14.4; Magnesium 1.9; Platelets 272; Potassium 4.6; Sodium 139; TSH 1.030  Recent Lipid Panel    Component Value Date/Time   CHOL 151 04/01/2022 1253   TRIG 172 (H) 04/01/2022 1253   HDL 38 (L) 04/01/2022 1253   CHOLHDL 4.0 04/01/2022 1253   LDLCALC 83 04/01/2022 1253     Risk Assessment/Calculations:                Physical Exam:    VS:  BP 116/82 (BP Location: Left Arm, Patient Position: Sitting, Cuff Size: Large)   Pulse 77   Ht 5\' 8"  (1.727 m)   Wt 241 lb (109.3 kg)   SpO2 97%   BMI 36.64 kg/m     Wt Readings from Last 3 Encounters:  02/09/23 241 lb (109.3 kg)  02/08/23 238 lb (108 kg)  09/16/22 225 lb (102.1 kg)     GEN:  Well nourished, well developed in no acute distress. Sitting comfortably on the exam table  HEENT: Normal NECK: No JVD; Bilateral thyroid enlargement noted  LYMPHATICS: No  lymphadenopathy CARDIAC: RRR, no murmurs, rubs, gallops. Radial pulses 2+ bilaterally  RESPIRATORY:  Clear to auscultation without rales, wheezing or rhonchi . Normal WOB on room air  ABDOMEN: Soft, non-tender, non-distended MUSCULOSKELETAL:  No edema in BLE; No deformity  SKIN: Warm and dry NEUROLOGIC:  Alert and oriented x 3 PSYCHIATRIC:  Normal affect   ASSESSMENT:    1. PVC's (premature ventricular contractions)   2. Palpitations   3. Tobacco abuse   4. LVH (left ventricular hypertrophy)   5. Thyroid enlargement   6. Essential hypertension    PLAN:    In order of problems listed above:  PVCs  Palpitations  - Cardiac monitor from 03/2021 showed HR range 48-164bpm, avg 785bpm, 1 run NSVT lasting 5 beats, rare PVCs/PACs  - In 09/2022, she had breakthrough palpitations and was started on PRN metoprolol. TSH/Free T4 normal at that time. CBC and BMP grossly normal  - Recent echo from 10/2022 showed EF 65-70%, no regional wall motion abnormalities, normal LV diastolic parameters - Today, she reports having increased episodes of palpitations for the past 4 days. Palpitations seem to be associated with exertion, resolve after about 5-10 minutes of rest. Seem to "take her breath away" - Discussed triggers for PVCs including caffeine, stress, dehydration  - Continue metoprolol tartate 12.5 mg PRN for palpitations. BP low-normal, so I am hesitant to have her her take metoprolol BID. She is agreeable to continuing metoprolol PRN  - Ordered a 2 week zio to assess PVC burden and evaluate for other arrhythmias   Tobacco Abuse  - Patient would like to quit smoking, but is having a hard time. Previously has tried nicotine patches without success. Considering Chantix, but she is concerned about side effects  - Discussed tobacco cessation   LVH on Echocardiogram  - Echocardiogram from 06/2021 showed EF 60-65%, no regional wall motion abnormalities, moderate LVH - Repeat echocardiogram from 10/2022  showed EF 65-70%, no left ventricular hypertrophy   Thyroid Enlargement  - Noted on exam on 09/16/22 - TSH and free T4 normal  - Thyroid ultrasound on 09/23/22 showed a 1.9 cm right inferior thyroid TR 3 nodule. Recommended follow-up in 1 year  - Instructed patient to follow up with PCP for further management - she already has follow up with PCP arranged   Essential HTN  - BP has significantly improved since patient lost weight - Now BP is within normal limits without use of hypertensive medications   Medication Adjustments/Labs and Tests Ordered: Current medicines are reviewed at length with the patient today.  Concerns regarding medicines are outlined above.  Orders Placed This Encounter  Procedures   LONG TERM MONITOR (3-14 DAYS)   No orders of the defined types were placed in this encounter.   Patient Instructions  Medication Instructions:   Your physician recommends that you continue on your current medications as directed. Please refer to the Current Medication list given to you today.   *If you need a refill on your cardiac medications before your next appointment, please call your pharmacy*   Lab Work:  None ordered.  If you have labs (blood work) drawn today and your tests are completely normal, you will receive your results only by: MyChart Message (if you have MyChart) OR A paper copy in the mail If you have any lab test that is abnormal or we need to change your treatment, we will call you to review the results.   Testing/Procedures:  Christena Deem- Long Term Monitor Instructions  Your physician has requested you wear a ZIO patch monitor for 14 days.  This is a single patch monitor. [employer] supplies one patch monitor per enrollment. Additional stickers are not available. Please do not apply patch if you will be having a Nuclear Stress Test,  Echocardiogram, Cardiac CT, MRI, or Chest Xray during the period you would be wearing the  monitor. The patch cannot be worn  during these tests. You cannot remove and re-apply the  ZIO XT patch monitor.  Your ZIO patch monitor will be mailed 3 day USPS to your address on file. It may take 3-5 days  to receive your  monitor after you have been enrolled.  Once you have received your monitor, please review the enclosed instructions. Your monitor  has already been registered assigning a specific monitor serial # to you.  Billing and Patient Assistance Program Information  We have supplied [employer] with any of your insurance information on file for billing purposes. [employer] offers a sliding scale Patient Assistance Program for patients that do not have  insurance, or whose insurance does not completely cover the cost of the ZIO monitor.  You must apply for the Patient Assistance Program to qualify for this discounted rate.  To apply, please call [employer] at [phone], select option 4, select option 2, ask to apply for  Patient Assistance Program. Meredeth Ide will ask your household income, and how many people  are in your household. They will quote your out-of-pocket cost based on that information.  [employer] will also be able to set up a 5-month, interest-free payment plan if needed.  Applying the monitor   Shave hair from upper left chest.  Hold abrader disc by orange tab. Rub abrader in 40 strokes over the upper left chest as  indicated in your monitor instructions.  Clean area with 4 enclosed alcohol pads. Let dry.  Apply patch as indicated in monitor instructions. Patch will be placed under collarbone on left  side of chest with arrow pointing upward.  Rub patch adhesive wings for 2 minutes. Remove white label marked "1". Remove the white  label marked "2". Rub patch adhesive wings for 2 additional minutes.  While looking in a mirror, press and release button in center of patch. A small green light will  flash 3-4 times. This will be your only indicator that the monitor has been turned on.  Do not shower for the  first 24 hours. You may shower after the first 24 hours.  Press the button if you feel a symptom. You will hear a small click. Record Date, Time and  Symptom in the Patient Logbook.  When you are ready to remove the patch, follow instructions on the last 2 pages of Patient  Logbook. Stick patch monitor onto the last page of Patient Logbook.  Place Patient Logbook in the blue and white box. Use locking tab on box and tape box closed  securely. The blue and white box has prepaid postage on it. Please place it in the mailbox as  soon as possible. Your physician should have your test results approximately 7 days after the  monitor has been mailed back to [facility].  Call [facility] Customer Care at [phone] if you have questions regarding  your ZIO XT patch monitor. Call them immediately if you see an orange light blinking on your  monitor.  If your monitor falls off in less than 4 days, contact our Monitor department at [phone].  If your monitor becomes loose or falls off after 4 days call [employer] at [phone] for  suggestions on securing your monitor    Follow-Up: At [facility], you and your health needs are our priority.  As part of our continuing mission to provide you with exceptional heart care, we have created designated Provider Care Teams.  These Care Teams include your primary Cardiologist (physician) and Advanced Practice Providers (APPs -  Physician Assistants and Nurse Practitioners) who all work together to provide you with the care you need, when you need it.  We recommend signing up for the patient portal called "MyChart".  Sign up information is provided on this After Visit  Summary.  MyChart is used to connect with patients for Virtual Visits (Telemedicine).  Patients are able to view lab/test results, encounter notes, upcoming appointments, etc.  Non-urgent messages can be sent to your provider as well.   To learn more about what you can do with  MyChart, go to [email or web address].    Your next appointment:   9 week(s)  Provider:   Wallis Bamberg, NP ([place])       Signed, Jonita Albee, PA-C  02/09/2023 11:08 AM    [facility]

## 2023-02-08 ENCOUNTER — Telehealth (INDEPENDENT_AMBULATORY_CARE_PROVIDER_SITE_OTHER): Payer: BC Managed Care – PPO | Admitting: Family Medicine

## 2023-02-08 ENCOUNTER — Encounter (INDEPENDENT_AMBULATORY_CARE_PROVIDER_SITE_OTHER): Payer: Self-pay | Admitting: Family Medicine

## 2023-02-08 VITALS — Ht 68.0 in | Wt 238.0 lb

## 2023-02-08 DIAGNOSIS — Z7985 Long-term (current) use of injectable non-insulin antidiabetic drugs: Secondary | ICD-10-CM

## 2023-02-08 DIAGNOSIS — E1169 Type 2 diabetes mellitus with other specified complication: Secondary | ICD-10-CM | POA: Diagnosis not present

## 2023-02-08 DIAGNOSIS — K219 Gastro-esophageal reflux disease without esophagitis: Secondary | ICD-10-CM | POA: Diagnosis not present

## 2023-02-08 DIAGNOSIS — E669 Obesity, unspecified: Secondary | ICD-10-CM | POA: Diagnosis not present

## 2023-02-08 DIAGNOSIS — F429 Obsessive-compulsive disorder, unspecified: Secondary | ICD-10-CM | POA: Insufficient documentation

## 2023-02-08 DIAGNOSIS — Z6836 Body mass index (BMI) 36.0-36.9, adult: Secondary | ICD-10-CM

## 2023-02-08 HISTORY — DX: Obsessive-compulsive disorder, unspecified: F42.9

## 2023-02-09 ENCOUNTER — Telehealth: Payer: Self-pay

## 2023-02-09 ENCOUNTER — Ambulatory Visit: Payer: BC Managed Care – PPO | Attending: Physician Assistant | Admitting: Cardiology

## 2023-02-09 ENCOUNTER — Ambulatory Visit (INDEPENDENT_AMBULATORY_CARE_PROVIDER_SITE_OTHER): Payer: BC Managed Care – PPO

## 2023-02-09 ENCOUNTER — Encounter: Payer: Self-pay | Admitting: Cardiology

## 2023-02-09 VITALS — BP 116/82 | HR 77 | Ht 68.0 in | Wt 241.0 lb

## 2023-02-09 DIAGNOSIS — I493 Ventricular premature depolarization: Secondary | ICD-10-CM | POA: Diagnosis not present

## 2023-02-09 DIAGNOSIS — E049 Nontoxic goiter, unspecified: Secondary | ICD-10-CM

## 2023-02-09 DIAGNOSIS — I10 Essential (primary) hypertension: Secondary | ICD-10-CM

## 2023-02-09 DIAGNOSIS — R002 Palpitations: Secondary | ICD-10-CM

## 2023-02-09 DIAGNOSIS — Z720 Tobacco use: Secondary | ICD-10-CM | POA: Diagnosis not present

## 2023-02-09 DIAGNOSIS — I517 Cardiomegaly: Secondary | ICD-10-CM

## 2023-02-09 NOTE — Telephone Encounter (Signed)
LMVM for [facility] requesting most recent lab results for patient.

## 2023-02-09 NOTE — Progress Notes (Unsigned)
Enrolled for [employer] to mail a ZIO XT long term holter monitor to the patients address on file.   Dr. Normajean Baxter to read.

## 2023-02-09 NOTE — Patient Instructions (Signed)
Medication Instructions:   Your physician recommends that you continue on your current medications as directed. Please refer to the Current Medication list given to you today.   *If you need a refill on your cardiac medications before your next appointment, please call your pharmacy*   Lab Work:  None ordered.  If you have labs (blood work) drawn today and your tests are completely normal, you will receive your results only by: [place] (if you have MyChart) OR A paper copy in the mail If you have any lab test that is abnormal or we need to change your treatment, we will call you to review the results.   Testing/Procedures:  Emily Weber- Long Term Monitor Instructions  Your physician has requested you wear a ZIO patch monitor for 14 days.  This is a single patch monitor. [employer] supplies one patch monitor per enrollment. Additional stickers are not available. Please do not apply patch if you will be having a Nuclear Stress Test,  Echocardiogram, Cardiac CT, MRI, or Chest Xray during the period you would be wearing the  monitor. The patch cannot be worn during these tests. You cannot remove and re-apply the  ZIO XT patch monitor.  Your ZIO patch monitor will be mailed 3 day USPS to your address on file. It may take 3-5 days  to receive your monitor after you have been enrolled.  Once you have received your monitor, please review the enclosed instructions. Your monitor  has already been registered assigning a specific monitor serial # to you.  Billing and Patient Assistance Program Information  We have supplied [employer] with any of your insurance information on file for billing purposes. [employer] offers a sliding scale Patient Assistance Program for patients that do not have  insurance, or whose insurance does not completely cover the cost of the ZIO monitor.  You must apply for the Patient Assistance Program to qualify for this discounted rate.  To apply, please call [employer] at  [phone], select option 4, select option 2, ask to apply for  Patient Assistance Program. Emily Weber will ask your household income, and how many people  are in your household. They will quote your out-of-pocket cost based on that information.  [employer] will also be able to set up a 62-month, interest-free payment plan if needed.  Applying the monitor   Shave hair from upper left chest.  Hold abrader disc by orange tab. Rub abrader in 40 strokes over the upper left chest as  indicated in your monitor instructions.  Clean area with 4 enclosed alcohol pads. Let dry.  Apply patch as indicated in monitor instructions. Patch will be placed under collarbone on left  side of chest with arrow pointing upward.  Rub patch adhesive wings for 2 minutes. Remove white label marked "1". Remove the white  label marked "2". Rub patch adhesive wings for 2 additional minutes.  While looking in a mirror, press and release button in center of patch. A small green light will  flash 3-4 times. This will be your only indicator that the monitor has been turned on.  Do not shower for the first 24 hours. You may shower after the first 24 hours.  Press the button if you feel a symptom. You will hear a small click. Record Date, Time and  Symptom in the Patient Logbook.  When you are ready to remove the patch, follow instructions on the last 2 pages of Patient  Logbook. Stick patch monitor onto the last page of Patient  Logbook.  Place Patient Logbook in the blue and white box. Use locking tab on box and tape box closed  securely. The blue and white box has prepaid postage on it. Please place it in the mailbox as  soon as possible. Your physician should have your test results approximately 7 days after the  monitor has been mailed back to [facility].  Call [facility] Customer Care at [phone] if you have questions regarding  your ZIO XT patch monitor. Call them immediately if you see an orange light  blinking on your  monitor.  If your monitor falls off in less than 4 days, contact our Monitor department at [phone].  If your monitor becomes loose or falls off after 4 days call [employer] at [phone] for  suggestions on securing your monitor    Follow-Up: At [facility], you and your health needs are our priority.  As part of our continuing mission to provide you with exceptional heart care, we have created designated Provider Care Teams.  These Care Teams include your primary Cardiologist (physician) and Advanced Practice Providers (APPs -  Physician Assistants and Nurse Practitioners) who all work together to provide you with the care you need, when you need it.  We recommend signing up for the patient portal called "MyChart".  Sign up information is provided on this After Visit Summary.  MyChart is used to connect with patients for Virtual Visits (Telemedicine).  Patients are able to view lab/test results, encounter notes, upcoming appointments, etc.  Non-urgent messages can be sent to your provider as well.   To learn more about what you can do with MyChart, go to [email or web address].    Your next appointment:   9 week(s)  Provider:   Wallis Bamberg, NP Rosalita Levan)

## 2023-02-10 DIAGNOSIS — F422 Mixed obsessional thoughts and acts: Secondary | ICD-10-CM | POA: Diagnosis not present

## 2023-02-11 DIAGNOSIS — I493 Ventricular premature depolarization: Secondary | ICD-10-CM

## 2023-02-11 DIAGNOSIS — F422 Mixed obsessional thoughts and acts: Secondary | ICD-10-CM | POA: Diagnosis not present

## 2023-02-18 DIAGNOSIS — I4729 Other ventricular tachycardia: Secondary | ICD-10-CM | POA: Diagnosis not present

## 2023-02-18 DIAGNOSIS — F422 Mixed obsessional thoughts and acts: Secondary | ICD-10-CM | POA: Diagnosis not present

## 2023-02-18 DIAGNOSIS — J0141 Acute recurrent pansinusitis: Secondary | ICD-10-CM | POA: Diagnosis not present

## 2023-02-18 DIAGNOSIS — G518 Other disorders of facial nerve: Secondary | ICD-10-CM | POA: Diagnosis not present

## 2023-02-18 DIAGNOSIS — E041 Nontoxic single thyroid nodule: Secondary | ICD-10-CM | POA: Diagnosis not present

## 2023-02-25 DIAGNOSIS — F422 Mixed obsessional thoughts and acts: Secondary | ICD-10-CM | POA: Diagnosis not present

## 2023-02-26 DIAGNOSIS — E041 Nontoxic single thyroid nodule: Secondary | ICD-10-CM | POA: Diagnosis not present

## 2023-02-26 DIAGNOSIS — J0141 Acute recurrent pansinusitis: Secondary | ICD-10-CM | POA: Diagnosis not present

## 2023-02-26 DIAGNOSIS — R519 Headache, unspecified: Secondary | ICD-10-CM | POA: Diagnosis not present

## 2023-03-02 DIAGNOSIS — F429 Obsessive-compulsive disorder, unspecified: Secondary | ICD-10-CM | POA: Diagnosis not present

## 2023-03-02 DIAGNOSIS — F411 Generalized anxiety disorder: Secondary | ICD-10-CM | POA: Diagnosis not present

## 2023-03-02 DIAGNOSIS — F331 Major depressive disorder, recurrent, moderate: Secondary | ICD-10-CM | POA: Diagnosis not present

## 2023-03-03 DIAGNOSIS — F422 Mixed obsessional thoughts and acts: Secondary | ICD-10-CM | POA: Diagnosis not present

## 2023-03-04 NOTE — Progress Notes (Signed)
TeleHealth Visit:  This visit was completed with telemedicine (audio/video) technology. Emily Weber has verbally consented to this TeleHealth visit. The patient is located at home, the provider is located at home. The participants in this visit include the listed provider and patient. The visit was conducted today via MyChart video.  OBESITY Emily Weber is here to discuss her progress with her obesity treatment plan along with follow-up of her obesity related diagnoses.   Today's visit was # 13 Starting weight: 250 lbs Starting date: 12/17/2021 Weight at last in office visit: 220 lbs on 07/20/22 Total weight loss: 30 lbs at last in office visit on 07/20/22. Weight reported at last virtual visit on 02/08/2023: 238 pounds Today's reported weight (03/09/23):  236 lbs  Nutrition Plan: keeping a food journal with goal of 1500-1600 calories and 100 grams of protein daily -  Current exercise:  walking, house work  Interim History:  Return to care 3 weeks ago after not having a visit since October 2023.  She had regained 18 pounds. She reports her family is eating more at home. Always has breakfast and lunch at home. May have dinner out but making better choices. Her kids are eating healthier foods. She is [place]. She is not journaling consistently.  She is struggling with sweets cravings after dinner. Has [place] bar but is not always satisfied.   She would like to do all virtual visits.  Assessment/Plan:  1. Type 2 Diabetes Mellitus with other specified complication, without long-term current use of insulin Dose of Ozempic increased to 2 mg by integrative medicine provider- Emily Blow, NP. She is unsure if it has helped with cravings.  She has been on Mounjaro 12.5 mg also and did not feel like it helped much with appetite or cravings.  Lab Results  Component Value Date   HGBA1C 4.9 04/01/2022   HGBA1C 5.1 11/04/2021   Lab Results  Component Value Date    LDLCALC 83 04/01/2022   CREATININE 0.61 09/16/2022   No results found for: "GFR"  Plan: Continue Ozempic 2 mg per integrative medicine provider. She will try to obtain her recent lab work and send these to me through MyChart.  2. Generalized Obesity: Current BMI 35  Emily Weber is currently in the action stage of change. As such, her goal is to continue with weight loss efforts.  She has agreed to keeping a food journal with goal of 1500-1600 calories and 100 grams of protein daily.  1.  Protein equivalent sent via MyChart. 2.  Emphasized importance of protein for reducing hunger and cravings. 3.  She will journal at least 5 days/week.  Exercise goals: Cardio 30 minutes 5 days/week.  Behavioral modification strategies: increasing lean protein intake, decreasing simple carbohydrates , meal planning , keep a strict food journal, and increase frequency of journaling.  Emily Weber has agreed to follow-up with our clinic in 4 weeks.  No orders of the defined types were placed in this encounter.   Medications Discontinued During This Encounter  Medication Reason   Semaglutide, 1 MG/DOSE, (OZEMPIC, 1 MG/DOSE,) 2 MG/1.5ML SOPN      No orders of the defined types were placed in this encounter.     Objective:   VITALS: Per patient if applicable, see vitals. GENERAL: Alert and in no acute distress. CARDIOPULMONARY: No increased WOB. Speaking in clear sentences.  PSYCH: Pleasant and cooperative. Speech normal rate and rhythm. Affect is appropriate. Insight and judgement are appropriate. Attention is focused, linear, and appropriate.  NEURO: Oriented as arrived to appointment on time with no prompting.   Attestation Statements:   Reviewed by clinician on day of visit: allergies, medications, problem list, medical history, surgical history, family history, social history, and previous encounter notes.  Time spent on visit including the items listed below was 30 minutes.  -preparing to see  the patient (e.g., review of tests, history, previous notes) -obtaining and/or reviewing separately obtained history -counseling and educating the patient/family/caregiver -documenting clinical information in the electronic or other health record -ordering medications, tests, or procedures -independently interpreting results and communicating results to the patient/ family/caregiver -referring and communicating with other health care professionals  -care coordination   This was prepared with the assistance of Engineer, civil (consulting).  Occasional wrong-word or sound-a-like substitutions may have occurred due to the inherent limitations of voice recognition software.

## 2023-03-07 DIAGNOSIS — F422 Mixed obsessional thoughts and acts: Secondary | ICD-10-CM | POA: Diagnosis not present

## 2023-03-09 ENCOUNTER — Telehealth (INDEPENDENT_AMBULATORY_CARE_PROVIDER_SITE_OTHER): Payer: BC Managed Care – PPO | Admitting: Family Medicine

## 2023-03-09 ENCOUNTER — Encounter (INDEPENDENT_AMBULATORY_CARE_PROVIDER_SITE_OTHER): Payer: Self-pay | Admitting: Family Medicine

## 2023-03-09 VITALS — Ht 68.0 in | Wt 236.0 lb

## 2023-03-09 DIAGNOSIS — E669 Obesity, unspecified: Secondary | ICD-10-CM | POA: Diagnosis not present

## 2023-03-09 DIAGNOSIS — Z6835 Body mass index (BMI) 35.0-35.9, adult: Secondary | ICD-10-CM | POA: Diagnosis not present

## 2023-03-09 DIAGNOSIS — Z7985 Long-term (current) use of injectable non-insulin antidiabetic drugs: Secondary | ICD-10-CM | POA: Diagnosis not present

## 2023-03-09 DIAGNOSIS — E1169 Type 2 diabetes mellitus with other specified complication: Secondary | ICD-10-CM

## 2023-03-09 DIAGNOSIS — Z6833 Body mass index (BMI) 33.0-33.9, adult: Secondary | ICD-10-CM

## 2023-03-10 DIAGNOSIS — F422 Mixed obsessional thoughts and acts: Secondary | ICD-10-CM | POA: Diagnosis not present

## 2023-03-16 DIAGNOSIS — F422 Mixed obsessional thoughts and acts: Secondary | ICD-10-CM | POA: Diagnosis not present

## 2023-03-17 DIAGNOSIS — F422 Mixed obsessional thoughts and acts: Secondary | ICD-10-CM | POA: Diagnosis not present

## 2023-03-18 ENCOUNTER — Other Ambulatory Visit: Payer: Self-pay

## 2023-03-23 DIAGNOSIS — F422 Mixed obsessional thoughts and acts: Secondary | ICD-10-CM | POA: Diagnosis not present

## 2023-03-26 NOTE — Progress Notes (Deleted)
Cardiology Office Note:    Date:  03/26/2023   ID:  Emily Weber, DOB 03-29-1986, MRN [id number]  PCP:  Street, Stephanie Coup, MD   [facility] Providers Cardiologist:  Garwin Brothers, MD { Click to update primary MD,subspecialty MD or APP then REFRESH:1}    Referring MD: Street, Stephanie Coup, *   No chief complaint on file. ***  History of Present Illness:    Emily Weber is a 37 y.o. female with a hx of HTN, NSVT, PVCs, LVH by echo 2022, obesity, tobacco abuse, depression, DM2, dyslipidemia, PCOS.  Monitor for evaluation of palpitations in 03/2021 that showed heart rate ranging from 48-146 bpm with an average of 75 bpm, 1 episode of NSVT lasting 5 beats, rare PVCs/PACs.   Echocardiogram in 06/2021 for evaluation of heart murmur that showed EF 60-65%, no regional wall motion abnormalities, moderate LVH, normal RV systolic function. Stress echo 07/2021 showed no evidence of inducible ischemia. Repeat echo 10/15/22 revealed EF 65-70%, no LVH.   Evaluated by Robet Leu PA on 02/09/23 with concerns of palpitations over the previous few days, were more notable with exertion. Repeat monitor was arranged and completed on 03/10/23 revealing predominant rhythm was sinus, rare SVEs and isolated PVCs.     Past Medical History:  Diagnosis Date   [redacted] weeks gestation of pregnancy    Abnormal MSAFP (maternal serum alpha-fetoprotein), elevated 01/14/2018   4.06 MoM   Anxiety    At risk for hypoglycemia 04/06/2022   B12 deficiency 04/06/2022   Back pain    Blood glucose abnormal 07/22/2016   Cardiac murmur 06/10/2021   Chest pain of uncertain etiology 06/10/2021   Cigarette smoker 06/10/2021   Depression    Diabetes mellitus (HCC) 07/01/2017   Diabetes mellitus type 2 in obese 08/03/2017   HgbA1C: 5.7 at [facility]   Dyslipidemia    Elevated AFP 12/26/2017   Spina Bifida risk 1:10 Referred to MFM for level II u/s and counseling.   Gallbladder problem    Generalized obesity:  Starting BMI 38.0 02/04/2023   GERD (gastroesophageal reflux disease)    History of cesarean section 10/27/2017   Desires TOL, op note requested   Hyperlipidemia associated with type 2 diabetes mellitus (HCC) mixed 01/22/2022   Hypertension 07/01/2017   Hypertension, benign    Hypertriglyceridemia 06/18/2022   Insulin resistance 07/26/2017   Irregular periods 08/21/2016   Low serum progesterone 05/26/2021   LVH (left ventricular hypertrophy)    Mild obstructive sleep apnea    Morbid obesity (HCC) 06/10/2021   Morbid obesity with BMI of 40.0-44.9, adult (HCC) 08/21/2016   Nonsustained ventricular tachycardia (HCC) 06/10/2021   Obsessive-compulsive disorder 02/08/2023   Palpitations    Panic attack    PCOS (polycystic ovarian syndrome) 07/26/2017   Post-cholecystectomy syndrome    chronic loose stools   Preeclampsia, third trimester 04/06/2014   Pregnancy induced hypertension    PVC's (premature ventricular contractions)    Pyelonephritis    Reflux gastritis    Renal calculi 07/01/2017   Smoker 08/21/2016   Vitamin D deficiency     Past Surgical History:  Procedure Laterality Date   ADENOIDECTOMY     CESAREAN SECTION  04/2014   CHOLECYSTECTOMY  04/2015   DILATION AND CURETTAGE OF UTERUS  2015   KIDNEY STONE SURGERY     LITHOTRIPSY     TONSILLECTOMY     URETERAL STENT PLACEMENT Bilateral 07/02/2017   [facility] Nicanor [facility] at [facility]   VENTRAL HERNIA REPAIR  01/2020   Dr. Lequita Halt    Current Medications: No outpatient medications have been marked as taking for the 03/29/23 encounter (Appointment) with Emily Dibble, NP.     Allergies:   Patient has no known allergies.   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  Tobacco Use   Smoking status:  Every Day    Types: Cigarettes    Last attempt to quit: 2019    Years since quitting: 5.4   Smokeless tobacco: Never  Substance and Sexual Activity   Alcohol use: No   Drug use: No   Sexual activity: Not on file  Other Topics Concern   Not on file  Social History Narrative   Not on file   Social Determinants of Health   Financial Resource Strain: Not on file  Food Insecurity: Not on file  Transportation Needs: Not on file  Physical Activity: Not on file  Stress: Not on file  Social Connections: Not on file     Family History: The patient's ***family history includes Anxiety disorder in her mother; Cancer in her mother; Depression in her mother; Diabetes in her maternal grandfather, mother, and paternal grandfather; Hyperlipidemia in her mother; Hypertension in her mother; Obesity in her mother; Skin cancer in her mother; Sleep apnea in her mother.  ROS:   Please see the history of present illness.    *** All other systems reviewed and are negative.  EKGs/Labs/Other Studies Reviewed:    The following studies were reviewed today: ***  EKG:  EKG is *** ordered today.  The ekg ordered today demonstrates ***  Recent Labs: 04/01/2022: ALT 19 09/16/2022: BUN 14; Creatinine, Ser 0.61; Hemoglobin 14.4; Magnesium 1.9; Platelets 272; Potassium 4.6; Sodium 139; TSH 1.030  Recent Lipid Panel    Component Value Date/Time   CHOL 151 04/01/2022 1253   TRIG 172 (H) 04/01/2022 1253   HDL 38 (L) 04/01/2022 1253   CHOLHDL 4.0 04/01/2022 1253   LDLCALC 83 04/01/2022 1253     Risk Assessment/Calculations:   {Does this patient have ATRIAL FIBRILLATION?:[id number]}  No BP recorded.  {Refresh Note OR Click here to enter BP  :1}***         Physical Exam:    VS:  There were no vitals taken for this visit.    Wt Readings from Last 3 Encounters:  03/09/23 236 lb (107 kg)  02/09/23 241 lb (109.3 kg)  02/08/23 238 lb (108 kg)     GEN: *** Well nourished, well developed in no acute  distress HEENT: Normal NECK: No JVD; No carotid bruits LYMPHATICS: No lymphadenopathy CARDIAC: ***RRR, no murmurs, rubs, gallops RESPIRATORY:  Clear to auscultation without rales, wheezing or rhonchi  ABDOMEN: Soft, non-tender, non-distended MUSCULOSKELETAL:  No edema; No deformity  SKIN: Warm and dry NEUROLOGIC:  Alert and oriented x 3 PSYCHIATRIC:  Normal affect   ASSESSMENT:    1. PVC's (premature ventricular contractions)   2. Palpitations   3. LVH (left ventricular hypertrophy)   4. NSVT (nonsustained ventricular tachycardia) (HCC)   5. Essential hypertension    PLAN:    In order of problems listed above:  ***      {Are you ordering a CV Procedure (e.g. stress test, cath, DCCV, TEE,  etc)?   Press F2        :[id number]    Medication Adjustments/Labs and Tests Ordered: Current medicines are reviewed at length with the patient today.  Concerns regarding medicines are outlined above.  No orders of the defined types were placed in this encounter.  No orders of the defined types were placed in this encounter.   There are no Patient Instructions on file for this visit.   Signed, Emily Dibble, NP  03/26/2023 4:11 PM    [facility]

## 2023-03-29 ENCOUNTER — Ambulatory Visit: Payer: BC Managed Care – PPO | Attending: Cardiology | Admitting: Cardiology

## 2023-03-29 DIAGNOSIS — I493 Ventricular premature depolarization: Secondary | ICD-10-CM

## 2023-03-29 DIAGNOSIS — I517 Cardiomegaly: Secondary | ICD-10-CM

## 2023-03-29 DIAGNOSIS — I4729 Other ventricular tachycardia: Secondary | ICD-10-CM

## 2023-03-29 DIAGNOSIS — I10 Essential (primary) hypertension: Secondary | ICD-10-CM

## 2023-03-29 DIAGNOSIS — R002 Palpitations: Secondary | ICD-10-CM

## 2023-03-31 DIAGNOSIS — R7989 Other specified abnormal findings of blood chemistry: Secondary | ICD-10-CM | POA: Diagnosis not present

## 2023-04-05 NOTE — Progress Notes (Unsigned)
TeleHealth Visit:  This visit was completed with telemedicine (audio/video) technology. Emily Weber has verbally consented to this TeleHealth visit. The patient is located at home, the provider is located at home. The participants in this visit include the listed provider and patient. The visit was conducted today via MyChart video.  OBESITY Emily Weber is here to discuss her progress with her obesity treatment plan along with follow-up of her obesity related diagnoses.    Today's visit was # 14  Starting weight: 250 lbs Starting date: 12/17/2021 Weight reported at last virtual office visit: 236 lbs on 03/09/23 Today's reported weight (04/06/23):  238 lbs Total weight loss: 12 lbs Weight change since last visit: +2 lbs  Nutrition Plan: keeping a food journal with goal of 1500-1600 calories and 100 grams of protein daily   Current exercise: swimming at her mom's pool, being more active  Interim History:  She hasn't been consistent with logging but is working on writing it down rather than just using the app. She realizes she is eating more than she thinks. She is cooking more at home. She has a sweets cravings and also reports hunger within an 1.5 hours after dinner. Protein intake likely inadequate.  Eating all of the prescribed protein: no Skipping meals: No Drinking adequate water: Yes Drinking sugar sweetened beverages: No Hunger controlled: moderately controlled. Cravings controlled:  moderately controlled.  Assessment/Plan:  1. Type 2 Diabetes Mellitus with other specified complication, without long-term current use of insulin HgbA1c is at goal. Last A1c was 4.9 CBGs: Not checking Episodes of hypoglycemia: no Medication(s): Ozempic 2 mg SQ weekly-prescribed by integrative medicine provider.  Dose will be increased to 2.4 mg.  Lab Results  Component Value Date   HGBA1C 4.9 04/01/2022   HGBA1C 5.1 11/04/2021   Lab Results  Component Value Date   LDLCALC 83 04/01/2022    CREATININE 0.61 09/16/2022   No results found for: "GFR"  Plan: Continue Ozempic per Theodora Blow, NP.   2. Eating disorder/emotional eating [name] has had issues with stress/emotional eating.  On Prozac 40 mg daily prescribed by psychiatry. Notes hunger and cravings.  Has history of nephrolithiasis.  Has been on bupropion in the past which caused increased anxiety.   Plan: Work on eating the prescribed protein to reduce hunger and cravings.   3. Morbid Obesity: Current BMI 36  [name] is currently in the action stage of change. As such, her goal is to continue with weight loss efforts.  She has agreed to the Category 3 plan.  1.  Switch to category 3. 2.  Focus on getting in 4 ounces of protein at lunch and at least 6 ounces of protein at dinner. 3.  Send category 3 with breakfast and lunch options via MyChart message.  Exercise goals: As is  Behavioral modification strategies: increasing lean protein intake, decreasing simple carbohydrates , and meal planning .  [name] has agreed to follow-up with our clinic in 3 weeks.  No orders of the defined types were placed in this encounter.   There are no discontinued medications.   No orders of the defined types were placed in this encounter.     Objective:   VITALS: Per patient if applicable, see vitals. GENERAL: Alert and in no acute distress. CARDIOPULMONARY: No increased WOB. Speaking in clear sentences.  PSYCH: Pleasant and cooperative. Speech normal rate and rhythm. Affect is appropriate. Insight and judgement are appropriate. Attention is focused, linear, and appropriate.  NEURO: Oriented as arrived to appointment on time with  no prompting.   Attestation Statements:   Reviewed by clinician on day of visit: allergies, medications, problem list, medical history, surgical history, family history, social history, and previous encounter notes.  Time spent on visit including the items listed below was 30 minutes.   -preparing to see the patient (e.g., review of tests, history, previous notes) -obtaining and/or reviewing separately obtained history -counseling and educating the patient/family/caregiver -documenting clinical information in the electronic or other health record -ordering medications, tests, or procedures -independently interpreting results and communicating results to the patient/ family/caregiver -referring and communicating with other health care professionals  -care coordination    This was prepared with the assistance of [employer].  Occasional wrong-word or sound-a-like substitutions may have occurred due to the inherent limitations of voice recognition software.

## 2023-04-06 ENCOUNTER — Telehealth (INDEPENDENT_AMBULATORY_CARE_PROVIDER_SITE_OTHER): Payer: BC Managed Care – PPO | Admitting: Family Medicine

## 2023-04-06 ENCOUNTER — Encounter (INDEPENDENT_AMBULATORY_CARE_PROVIDER_SITE_OTHER): Payer: Self-pay | Admitting: Family Medicine

## 2023-04-06 VITALS — Ht 68.0 in | Wt 238.0 lb

## 2023-04-06 DIAGNOSIS — Z7985 Long-term (current) use of injectable non-insulin antidiabetic drugs: Secondary | ICD-10-CM

## 2023-04-06 DIAGNOSIS — Z6836 Body mass index (BMI) 36.0-36.9, adult: Secondary | ICD-10-CM | POA: Diagnosis not present

## 2023-04-06 DIAGNOSIS — E282 Polycystic ovarian syndrome: Secondary | ICD-10-CM | POA: Diagnosis not present

## 2023-04-06 DIAGNOSIS — F5089 Other specified eating disorder: Secondary | ICD-10-CM

## 2023-04-06 DIAGNOSIS — E119 Type 2 diabetes mellitus without complications: Secondary | ICD-10-CM | POA: Diagnosis not present

## 2023-04-06 DIAGNOSIS — E1169 Type 2 diabetes mellitus with other specified complication: Secondary | ICD-10-CM

## 2023-04-06 DIAGNOSIS — R5383 Other fatigue: Secondary | ICD-10-CM | POA: Diagnosis not present

## 2023-04-06 DIAGNOSIS — E669 Obesity, unspecified: Secondary | ICD-10-CM

## 2023-04-06 LAB — BASIC METABOLIC PANEL
BUN: 16 (ref 4–21)
CO2: 21 (ref 13–22)
Chloride: 105 (ref 99–108)
Creatinine: 0.5 (ref 0.5–1.1)
Glucose: 60
Potassium: 4.3 mEq/L (ref 3.5–5.1)
Sodium: 140 (ref 137–147)

## 2023-04-06 LAB — COMPREHENSIVE METABOLIC PANEL
Albumin: 4.3 (ref 3.5–5.0)
Calcium: 9 (ref 8.7–10.7)

## 2023-04-06 LAB — CBC AND DIFFERENTIAL
HCT: 42 (ref 36–46)
Hemoglobin: 14.4 (ref 12.0–16.0)
Neutrophils Absolute: 5.8
Platelets: 236 10*3/uL (ref 150–400)
WBC: 9.7

## 2023-04-06 LAB — HEPATIC FUNCTION PANEL
ALT: 19 U/L (ref 7–35)
AST: 18 (ref 13–35)
Alkaline Phosphatase: 45 (ref 25–125)
Bilirubin, Total: 0.3

## 2023-04-06 LAB — TESTOSTERONE: Testosterone: 16

## 2023-04-06 LAB — TSH: TSH: 0.83 (ref 0.41–5.90)

## 2023-04-06 LAB — CBC: RBC: 4.55 (ref 3.87–5.11)

## 2023-04-07 DIAGNOSIS — F422 Mixed obsessional thoughts and acts: Secondary | ICD-10-CM | POA: Diagnosis not present

## 2023-04-12 ENCOUNTER — Encounter: Payer: Self-pay | Admitting: Family Medicine

## 2023-04-19 DIAGNOSIS — F422 Mixed obsessional thoughts and acts: Secondary | ICD-10-CM | POA: Diagnosis not present

## 2023-04-26 NOTE — Progress Notes (Signed)
TeleHealth Visit:  This visit was completed with telemedicine (audio/video) technology. Asianae has verbally consented to this TeleHealth visit. The patient is located at home, the provider is located at home. The participants in this visit include the listed provider and patient. The visit was conducted today via MyChart video.  OBESITY Shian is here to discuss her progress with her obesity treatment plan along with follow-up of her obesity related diagnoses.    Today's visit was # 15  Starting weight: 250 lbs Starting date: 12/17/21 Weight reported at last virtual office visit: 238 lbs on 04/06/23 Today's reported weight (04/27/23):  235 lbs Total weight loss: 15 lbs Weight change since last visit: -3 lbs  Nutrition Plan: the Category 3 plan   Current exercise:  swimming at her mom's pool, being more active  Interim History:  Feels she is making better choices. She doesn't always eat all of the meat on her sandwich. She is eating 3 meals per day. Husband doing carnivore diet. She gets tired of meat.  Puts 4 ounces on her sandwich but then does not want the whole sandwich. Denies intake of SSBs. Notes sweets cravings.  Typical day of food: Breakfast: Magic Spoon cereal with [employer] milk Lunch: sandwich with lunch meat, pear or Lean Cuisine Dinner: protein and vegetables Snacks: Chobani Flips, cottage cheese with fruit, Yasso bars  Assessment/Plan:  1. Type 2 Diabetes Mellitus with other specified complication, without long-term current use of insulin HgbA1c is at goal. Last A1c was 4.9 on 04/01/22. Medication(s): Ozempic 2 mg SQ weekly-prescribed by Theodora Blow, NP (integrative medicine). Appetite well-controlled but struggles with sweets cravings. Lab Results  Component Value Date   HGBA1C 4.9 04/01/2022   HGBA1C 5.1 11/04/2021   Lab Results  Component Value Date   LDLCALC 83 04/01/2022   CREATININE 0.5 04/06/2023   No results found for:  "GFR"  Plan: Continue Ozempic 2 mg SQ weekly Discussed switching to [facility].  I told her to let me know should she decide and I will send in a prescription and start a prior authorization.   2. Generalized Obesity: Current BMI 35  Fidencia is currently in the action stage of change. As such, her goal is to continue with weight loss efforts.  She has agreed to the Category 3 plan.  1. May have 1 cup of cottage cheese for lunch lunch with fruit. 2.  Try sandwich on a wrap with plenty of vegetables like [employer].  Exercise goals:  as is  Behavioral modification strategies: increasing lean protein intake, decreasing simple carbohydrates , meal planning , and planning for success.  Janet has agreed to follow-up with our clinic in 4 weeks.  No orders of the defined types were placed in this encounter.   There are no discontinued medications.   No orders of the defined types were placed in this encounter.     Objective:   VITALS: Per patient if applicable, see vitals. GENERAL: Alert and in no acute distress. CARDIOPULMONARY: No increased WOB. Speaking in clear sentences.  PSYCH: Pleasant and cooperative. Speech normal rate and rhythm. Affect is appropriate. Insight and judgement are appropriate. Attention is focused, linear, and appropriate.  NEURO: Oriented as arrived to appointment on time with no prompting.   Attestation Statements:   Reviewed by clinician on day of visit: allergies, medications, problem list, medical history, surgical history, family history, social history, and previous encounter notes.   This was prepared with the assistance of [employer].  Occasional wrong-word or sound-a-like  substitutions may have occurred due to the inherent limitations of voice recognition software.

## 2023-04-27 ENCOUNTER — Telehealth (INDEPENDENT_AMBULATORY_CARE_PROVIDER_SITE_OTHER): Payer: BC Managed Care – PPO | Admitting: Family Medicine

## 2023-04-27 ENCOUNTER — Encounter (INDEPENDENT_AMBULATORY_CARE_PROVIDER_SITE_OTHER): Payer: Self-pay | Admitting: Family Medicine

## 2023-04-27 VITALS — Ht 68.0 in | Wt 235.0 lb

## 2023-04-27 DIAGNOSIS — Z6835 Body mass index (BMI) 35.0-35.9, adult: Secondary | ICD-10-CM

## 2023-04-27 DIAGNOSIS — E669 Obesity, unspecified: Secondary | ICD-10-CM

## 2023-04-27 DIAGNOSIS — Z7985 Long-term (current) use of injectable non-insulin antidiabetic drugs: Secondary | ICD-10-CM | POA: Diagnosis not present

## 2023-04-27 DIAGNOSIS — E1169 Type 2 diabetes mellitus with other specified complication: Secondary | ICD-10-CM

## 2023-05-02 DIAGNOSIS — F422 Mixed obsessional thoughts and acts: Secondary | ICD-10-CM | POA: Diagnosis not present

## 2023-05-24 ENCOUNTER — Encounter (INDEPENDENT_AMBULATORY_CARE_PROVIDER_SITE_OTHER): Payer: Self-pay | Admitting: Family Medicine

## 2023-05-24 DIAGNOSIS — F411 Generalized anxiety disorder: Secondary | ICD-10-CM | POA: Diagnosis not present

## 2023-05-24 DIAGNOSIS — F331 Major depressive disorder, recurrent, moderate: Secondary | ICD-10-CM | POA: Diagnosis not present

## 2023-05-24 DIAGNOSIS — F429 Obsessive-compulsive disorder, unspecified: Secondary | ICD-10-CM | POA: Diagnosis not present

## 2023-05-24 NOTE — Progress Notes (Unsigned)
TeleHealth Visit:  This visit was completed with telemedicine (audio/video) technology. Kylen has verbally consented to this TeleHealth visit. The patient is located at home, the provider is located at home. The participants in this visit include the listed provider and patient. The visit was conducted today via MyChart video.  OBESITY Acire is here to discuss her progress with her obesity treatment plan along with follow-up of her obesity related diagnoses.    Today's visit was # 16  Starting weight: 250 lbs Starting date: 12/17/21 Weight reported at last virtual office visit: 235 lbs on 04/27/23 Today's reported weight (05/25/23):  235 lbs Total weight loss: 15 lbs Weight change since last visit: 0 lbs  Nutrition Plan: the Category 3 plan  Current exercise:  walking   Interim History:  Consistency is an issue. She weighs daily and gets frustrated with what she sees on the scale. She gets full quickly but then gets hungry quickly.  Water intake is great. Meals are on plan. Protein intake is less than it should be. She recently went to [facility] and did not gain weight.  She feels the issue is with snacking in the afternoon.  She has tracked calories and protein in the past but did not do well with keeping up with it long-term.  Not eating all of the food on the plan., Protein intake is less than prescribed., Is not drinking sugar sweetened beverages., Is exceeding snack calorie allotment, Is not skipping meals, Not meeting protein goals., Water intake is adequate., and Reports excessive cravings.  Assessment/Plan:  1. Type 2 Diabetes Mellitus with other specified complication, without long-term current use of insulin HgbA1c is at goal. Last A1c was 4.9. Ozempic 2 mg SQ weekly-prescribed by Theodora Blow, NP (integrative medicine).  Has diarrhea with the Ozempic. Was on Mounjaro 12.5 mg in the past and had less side effects.  She feels it may have helped more with her  appetite. Lab Results  Component Value Date   HGBA1C 4.9 04/01/2022   HGBA1C 5.1 11/04/2021   Lab Results  Component Value Date   LDLCALC 83 04/01/2022   CREATININE 0.5 04/06/2023   No results found for: "GFR"  Plan: Discontinue Ozempic if we are able to get approval for [facility]. Start Mounjaro 12.5 mg SQ weekly   2. Eating disorder/emotional eating Carmencita has issues with stress/emotional eating-especially in the afternoon. She has OCD and anxiety.  Currently taking Prozac 20 mg and alprazolam 0.5 mg twice daily as needed. Has history of nephrolithiasis.  Bupropion caused severe anxiety.  Currently this is poorly controlled. Overall mood is stable.  Plan: She is not a candidate for topiramate or bupropion. We will start Mounjaro in hopes that this will help with "food noise ".  3. Morbid Obesity: Current BMI 35  Tajah is currently in the action stage of change. As such, her goal is to continue with weight loss efforts.  She has agreed to the Category 3 plan and keeping a food journal with goal of 1400-1700 calories and 100 grams of protein daily.  1.  Weigh only once weekly. 2.  Try journaling for at least a few weeks to increase awareness of calorie and protein intake.  Exercise goals: Walk as much as possible.  Behavioral modification strategies: increasing lean protein intake, decreasing simple carbohydrates , better snacking choices, planning for success, and keep a strict food journal.  Gearldene has agreed to follow-up with our clinic in 3 weeks.  No orders of the defined types were placed  in this encounter.   Medications Discontinued During This Encounter  Medication Reason   Semaglutide, 2 MG/DOSE, (OZEMPIC, 2 MG/DOSE,) 8 MG/3ML SOPN Duplicate     Meds ordered this encounter  Medications   tirzepatide (MOUNJARO) 12.5 MG/0.5ML Pen    Sig: Inject 12.5 mg into the skin once a week.    Dispense:  2 mL    Refill:  0    Order Specific Question:   Supervising  Provider    Answer:   Glennis Brink [[id number]]      Objective:   VITALS: Per patient if applicable, see vitals. GENERAL: Alert and in no acute distress. CARDIOPULMONARY: No increased WOB. Speaking in clear sentences.  PSYCH: Pleasant and cooperative. Speech normal rate and rhythm. Affect is appropriate. Insight and judgement are appropriate. Attention is focused, linear, and appropriate.  NEURO: Oriented as arrived to appointment on time with no prompting.   Attestation Statements:   Reviewed by clinician on day of visit: allergies, medications, problem list, medical history, surgical history, family history, social history, and previous encounter notes.   This was prepared with the assistance of [employer].  Occasional wrong-word or sound-a-like substitutions may have occurred due to the inherent limitations of voice recognition software.

## 2023-05-25 ENCOUNTER — Telehealth (INDEPENDENT_AMBULATORY_CARE_PROVIDER_SITE_OTHER): Payer: BC Managed Care – PPO | Admitting: Family Medicine

## 2023-05-25 ENCOUNTER — Encounter (INDEPENDENT_AMBULATORY_CARE_PROVIDER_SITE_OTHER): Payer: Self-pay | Admitting: Family Medicine

## 2023-05-25 VITALS — Ht 68.0 in | Wt 235.0 lb

## 2023-05-25 DIAGNOSIS — F509 Eating disorder, unspecified: Secondary | ICD-10-CM | POA: Insufficient documentation

## 2023-05-25 DIAGNOSIS — F411 Generalized anxiety disorder: Secondary | ICD-10-CM | POA: Diagnosis not present

## 2023-05-25 DIAGNOSIS — E669 Obesity, unspecified: Secondary | ICD-10-CM

## 2023-05-25 DIAGNOSIS — E1169 Type 2 diabetes mellitus with other specified complication: Secondary | ICD-10-CM

## 2023-05-25 DIAGNOSIS — Z6835 Body mass index (BMI) 35.0-35.9, adult: Secondary | ICD-10-CM | POA: Diagnosis not present

## 2023-05-25 DIAGNOSIS — F5089 Other specified eating disorder: Secondary | ICD-10-CM

## 2023-05-25 DIAGNOSIS — F422 Mixed obsessional thoughts and acts: Secondary | ICD-10-CM | POA: Diagnosis not present

## 2023-05-25 DIAGNOSIS — Z7985 Long-term (current) use of injectable non-insulin antidiabetic drugs: Secondary | ICD-10-CM

## 2023-05-25 MED ORDER — TIRZEPATIDE 12.5 MG/0.5ML ~~LOC~~ SOPN
12.5000 mg | PEN_INJECTOR | SUBCUTANEOUS | 0 refills | Status: AC
Start: 2023-05-25 — End: ?

## 2023-05-26 ENCOUNTER — Telehealth: Payer: Self-pay

## 2023-05-26 NOTE — Telephone Encounter (Signed)
Per Cover My Meds: WUJWJX:[id number];AOZHYQ:[id number];Review Type:Prior Auth;Coverage Start Date:04/26/2023;Coverage End Date:05/25/2024

## 2023-05-26 NOTE — Telephone Encounter (Signed)
PA submitted through Cover My Meds for [facility]. Awaiting insurance determination.  Key: [id number]

## 2023-06-08 DIAGNOSIS — F411 Generalized anxiety disorder: Secondary | ICD-10-CM | POA: Diagnosis not present

## 2023-06-08 DIAGNOSIS — F422 Mixed obsessional thoughts and acts: Secondary | ICD-10-CM | POA: Diagnosis not present

## 2023-06-09 DIAGNOSIS — M5416 Radiculopathy, lumbar region: Secondary | ICD-10-CM | POA: Diagnosis not present

## 2023-06-09 DIAGNOSIS — S39012D Strain of muscle, fascia and tendon of lower back, subsequent encounter: Secondary | ICD-10-CM | POA: Diagnosis not present

## 2023-06-09 DIAGNOSIS — Z6835 Body mass index (BMI) 35.0-35.9, adult: Secondary | ICD-10-CM | POA: Diagnosis not present

## 2023-06-09 DIAGNOSIS — E669 Obesity, unspecified: Secondary | ICD-10-CM | POA: Diagnosis not present

## 2023-06-10 NOTE — Progress Notes (Signed)
TeleHealth Visit:  This visit was completed with telemedicine (audio/video) technology. [name] has verbally consented to this TeleHealth visit. The patient is located at home, the provider is located at home. The participants in this visit include the listed provider and patient. The visit was conducted today via MyChart video.  OBESITY Emily Weber is here to discuss her progress with her obesity treatment plan along with follow-up of her obesity related diagnoses.    Today's visit was # 17  Starting weight: 250 lbs Starting date: 12/17/21 Weight reported at last virtual office visit: 235 lbs on 05/25/23 Today's reported weight (06/15/23):  237 lbs Total weight loss: 13 lbs  Nutrition Plan: the Category 3 plan and keeping a food journal with goal of 1400-1700 calories and 100 grams of protein daily - 0% adherence.  Current exercise:  walking 1-2 times per week  Interim History:  She started Mounjaro 12.5 mg- has had 2 injections. She feels cravings are a bit better. She is doing better with protein intake. She is not journaling and not following the cat 3 plan. She denies excessive hunger but reports wanting to snack throughout the day.  Assessment/Plan:  1. Type 2 Diabetes Mellitus with other specified complication, without long-term current use of insulin HgbA1c is at goal. Last A1c was 4.9. Medication(s): Mounjaro 12.5 mg SQ weekly.  She has 5 injections left of this dose.  Lab Results  Component Value Date   HGBA1C 4.9 04/01/2022   HGBA1C 5.1 11/04/2021   Lab Results  Component Value Date   LDLCALC 83 04/01/2022   CREATININE 0.5 04/06/2023   No results found for: "GFR"  Plan: Finish the last 5 injections of Mounjaro 12.5. Continue and increase dose Mounjaro 15 mg SQ weekly  2. Palpitations Has occasional palpitations but is not taking to metoprolol.  Plan: Take metoprolol if needed for palpitations.  3. Eating disorder/emotional eating Vicenta has had  issues with stress/emotional eating. Currently this is poorly controlled. Overall mood is stable. Medication(s): Other: none  Plan: Bupropion caused anxiety.  History of nephrolithiasis.  Increase dose of Mounjaro to 15. Have healthy snacks available such as cut up vegetables.   4. Morbid Obesity: Current BMI 38  [name] is currently in the action stage of change. As such, her goal is to continue with weight loss efforts.  She has agreed to keeping a food journal with goal of 1400-1700 calories and 100 grams of protein daily.  Exercise goals:  as is  Behavioral modification strategies: increasing lean protein intake, decreasing simple carbohydrates , better snacking choices, planning for success, increasing vegetables, emotional eating strategies, and keep healthy foods in the home.  [name] has agreed to follow-up with our clinic in 4 weeks.  No orders of the defined types were placed in this encounter.   There are no discontinued medications.   Meds ordered this encounter  Medications   tirzepatide (MOUNJARO) 15 MG/0.5ML Pen    Sig: Inject 15 mg into the skin once a week.    Dispense:  2 mL    Refill:  0    Order Specific Question:   Supervising Provider    Answer:   Glennis Brink [[id number]]      Objective:   VITALS: Per patient if applicable, see vitals. GENERAL: Alert and in no acute distress. CARDIOPULMONARY: No increased WOB. Speaking in clear sentences.  PSYCH: Pleasant and cooperative. Speech normal rate and rhythm. Affect is appropriate. Insight and judgement are appropriate. Attention is focused, linear, and appropriate.  NEURO: Oriented as arrived to appointment on time with no prompting.   Attestation Statements:   Reviewed by clinician on day of visit: allergies, medications, problem list, medical history, surgical history, family history, social history, and previous encounter notes.  This was prepared with the assistance of [employer].  Occasional  wrong-word or sound-a-like substitutions may have occurred due to the inherent limitations of voice recognition software.

## 2023-06-15 ENCOUNTER — Encounter (INDEPENDENT_AMBULATORY_CARE_PROVIDER_SITE_OTHER): Payer: Self-pay | Admitting: Family Medicine

## 2023-06-15 ENCOUNTER — Telehealth (INDEPENDENT_AMBULATORY_CARE_PROVIDER_SITE_OTHER): Payer: BC Managed Care – PPO | Admitting: Family Medicine

## 2023-06-15 VITALS — Ht 68.0 in | Wt 237.0 lb

## 2023-06-15 DIAGNOSIS — E1169 Type 2 diabetes mellitus with other specified complication: Secondary | ICD-10-CM

## 2023-06-15 DIAGNOSIS — F5089 Other specified eating disorder: Secondary | ICD-10-CM | POA: Diagnosis not present

## 2023-06-15 DIAGNOSIS — Z6838 Body mass index (BMI) 38.0-38.9, adult: Secondary | ICD-10-CM

## 2023-06-15 DIAGNOSIS — R002 Palpitations: Secondary | ICD-10-CM | POA: Diagnosis not present

## 2023-06-15 DIAGNOSIS — Z7985 Long-term (current) use of injectable non-insulin antidiabetic drugs: Secondary | ICD-10-CM

## 2023-06-15 DIAGNOSIS — Z6836 Body mass index (BMI) 36.0-36.9, adult: Secondary | ICD-10-CM

## 2023-06-15 MED ORDER — TIRZEPATIDE 15 MG/0.5ML ~~LOC~~ SOAJ: 15.0000 mg | SUBCUTANEOUS | 0 refills | Status: AC

## 2023-06-18 DIAGNOSIS — M5416 Radiculopathy, lumbar region: Secondary | ICD-10-CM | POA: Diagnosis not present

## 2023-06-22 DIAGNOSIS — F411 Generalized anxiety disorder: Secondary | ICD-10-CM | POA: Diagnosis not present

## 2023-06-22 DIAGNOSIS — F422 Mixed obsessional thoughts and acts: Secondary | ICD-10-CM | POA: Diagnosis not present

## 2023-06-26 DIAGNOSIS — M545 Low back pain, unspecified: Secondary | ICD-10-CM | POA: Diagnosis not present

## 2023-06-26 DIAGNOSIS — G8929 Other chronic pain: Secondary | ICD-10-CM | POA: Diagnosis not present

## 2023-06-26 DIAGNOSIS — M5136 Other intervertebral disc degeneration, lumbar region: Secondary | ICD-10-CM | POA: Diagnosis not present

## 2023-06-26 DIAGNOSIS — M47816 Spondylosis without myelopathy or radiculopathy, lumbar region: Secondary | ICD-10-CM | POA: Diagnosis not present

## 2023-07-07 DIAGNOSIS — F422 Mixed obsessional thoughts and acts: Secondary | ICD-10-CM | POA: Diagnosis not present

## 2023-07-07 DIAGNOSIS — F411 Generalized anxiety disorder: Secondary | ICD-10-CM | POA: Diagnosis not present

## 2023-07-08 DIAGNOSIS — M545 Low back pain, unspecified: Secondary | ICD-10-CM | POA: Diagnosis not present

## 2023-07-20 DIAGNOSIS — F331 Major depressive disorder, recurrent, moderate: Secondary | ICD-10-CM | POA: Diagnosis not present

## 2023-07-20 DIAGNOSIS — F411 Generalized anxiety disorder: Secondary | ICD-10-CM | POA: Diagnosis not present

## 2023-07-20 DIAGNOSIS — F429 Obsessive-compulsive disorder, unspecified: Secondary | ICD-10-CM | POA: Diagnosis not present

## 2023-07-21 DIAGNOSIS — E538 Deficiency of other specified B group vitamins: Secondary | ICD-10-CM | POA: Diagnosis not present

## 2023-07-21 DIAGNOSIS — G4733 Obstructive sleep apnea (adult) (pediatric): Secondary | ICD-10-CM | POA: Diagnosis not present

## 2023-07-21 DIAGNOSIS — K219 Gastro-esophageal reflux disease without esophagitis: Secondary | ICD-10-CM | POA: Diagnosis not present

## 2023-07-21 DIAGNOSIS — E559 Vitamin D deficiency, unspecified: Secondary | ICD-10-CM | POA: Diagnosis not present

## 2023-07-21 DIAGNOSIS — E1169 Type 2 diabetes mellitus with other specified complication: Secondary | ICD-10-CM | POA: Diagnosis not present

## 2023-07-21 DIAGNOSIS — E785 Hyperlipidemia, unspecified: Secondary | ICD-10-CM | POA: Diagnosis not present

## 2023-07-27 DIAGNOSIS — F411 Generalized anxiety disorder: Secondary | ICD-10-CM | POA: Diagnosis not present

## 2023-07-27 DIAGNOSIS — F422 Mixed obsessional thoughts and acts: Secondary | ICD-10-CM | POA: Diagnosis not present

## 2023-08-10 DIAGNOSIS — F422 Mixed obsessional thoughts and acts: Secondary | ICD-10-CM | POA: Diagnosis not present

## 2023-08-10 DIAGNOSIS — F411 Generalized anxiety disorder: Secondary | ICD-10-CM | POA: Diagnosis not present

## 2023-08-18 DIAGNOSIS — F5089 Other specified eating disorder: Secondary | ICD-10-CM | POA: Diagnosis not present

## 2023-08-18 DIAGNOSIS — E785 Hyperlipidemia, unspecified: Secondary | ICD-10-CM | POA: Diagnosis not present

## 2023-08-18 DIAGNOSIS — E559 Vitamin D deficiency, unspecified: Secondary | ICD-10-CM | POA: Diagnosis not present

## 2023-08-18 DIAGNOSIS — E1169 Type 2 diabetes mellitus with other specified complication: Secondary | ICD-10-CM | POA: Diagnosis not present

## 2023-08-31 DIAGNOSIS — F411 Generalized anxiety disorder: Secondary | ICD-10-CM | POA: Diagnosis not present

## 2023-08-31 DIAGNOSIS — F422 Mixed obsessional thoughts and acts: Secondary | ICD-10-CM | POA: Diagnosis not present

## 2023-09-03 DIAGNOSIS — E66811 Obesity, class 1: Secondary | ICD-10-CM | POA: Diagnosis not present

## 2023-09-03 DIAGNOSIS — Z01419 Encounter for gynecological examination (general) (routine) without abnormal findings: Secondary | ICD-10-CM | POA: Diagnosis not present

## 2023-09-13 DIAGNOSIS — F422 Mixed obsessional thoughts and acts: Secondary | ICD-10-CM | POA: Diagnosis not present

## 2023-09-13 DIAGNOSIS — F411 Generalized anxiety disorder: Secondary | ICD-10-CM | POA: Diagnosis not present

## 2023-09-15 DIAGNOSIS — E785 Hyperlipidemia, unspecified: Secondary | ICD-10-CM | POA: Diagnosis not present

## 2023-09-15 DIAGNOSIS — E66812 Obesity, class 2: Secondary | ICD-10-CM | POA: Diagnosis not present

## 2023-09-15 DIAGNOSIS — E559 Vitamin D deficiency, unspecified: Secondary | ICD-10-CM | POA: Diagnosis not present

## 2023-09-15 DIAGNOSIS — E1169 Type 2 diabetes mellitus with other specified complication: Secondary | ICD-10-CM | POA: Diagnosis not present

## 2023-09-29 DIAGNOSIS — F331 Major depressive disorder, recurrent, moderate: Secondary | ICD-10-CM | POA: Diagnosis not present

## 2023-09-29 DIAGNOSIS — F429 Obsessive-compulsive disorder, unspecified: Secondary | ICD-10-CM | POA: Diagnosis not present

## 2023-09-29 DIAGNOSIS — F411 Generalized anxiety disorder: Secondary | ICD-10-CM | POA: Diagnosis not present

## 2023-10-18 DIAGNOSIS — K219 Gastro-esophageal reflux disease without esophagitis: Secondary | ICD-10-CM | POA: Diagnosis not present

## 2023-10-18 DIAGNOSIS — G4733 Obstructive sleep apnea (adult) (pediatric): Secondary | ICD-10-CM | POA: Diagnosis not present

## 2023-10-18 DIAGNOSIS — E1169 Type 2 diabetes mellitus with other specified complication: Secondary | ICD-10-CM | POA: Diagnosis not present

## 2023-10-22 DIAGNOSIS — M79621 Pain in right upper arm: Secondary | ICD-10-CM | POA: Diagnosis not present

## 2023-10-22 DIAGNOSIS — Z6835 Body mass index (BMI) 35.0-35.9, adult: Secondary | ICD-10-CM | POA: Diagnosis not present

## 2023-10-28 DIAGNOSIS — N926 Irregular menstruation, unspecified: Secondary | ICD-10-CM | POA: Diagnosis not present

## 2023-11-12 DIAGNOSIS — R61 Generalized hyperhidrosis: Secondary | ICD-10-CM | POA: Diagnosis not present

## 2023-12-15 DIAGNOSIS — F411 Generalized anxiety disorder: Secondary | ICD-10-CM | POA: Diagnosis not present

## 2023-12-15 DIAGNOSIS — F422 Mixed obsessional thoughts and acts: Secondary | ICD-10-CM | POA: Diagnosis not present

## 2023-12-27 DIAGNOSIS — F411 Generalized anxiety disorder: Secondary | ICD-10-CM | POA: Diagnosis not present

## 2023-12-27 DIAGNOSIS — F422 Mixed obsessional thoughts and acts: Secondary | ICD-10-CM | POA: Diagnosis not present

## 2024-01-10 DIAGNOSIS — F411 Generalized anxiety disorder: Secondary | ICD-10-CM | POA: Diagnosis not present

## 2024-01-10 DIAGNOSIS — F422 Mixed obsessional thoughts and acts: Secondary | ICD-10-CM | POA: Diagnosis not present

## 2024-01-24 DIAGNOSIS — F422 Mixed obsessional thoughts and acts: Secondary | ICD-10-CM | POA: Diagnosis not present

## 2024-01-24 DIAGNOSIS — F411 Generalized anxiety disorder: Secondary | ICD-10-CM | POA: Diagnosis not present

## 2024-02-07 DIAGNOSIS — F411 Generalized anxiety disorder: Secondary | ICD-10-CM | POA: Diagnosis not present

## 2024-02-07 DIAGNOSIS — F422 Mixed obsessional thoughts and acts: Secondary | ICD-10-CM | POA: Diagnosis not present

## 2024-02-17 DIAGNOSIS — R002 Palpitations: Secondary | ICD-10-CM | POA: Diagnosis not present

## 2024-02-17 DIAGNOSIS — K921 Melena: Secondary | ICD-10-CM | POA: Diagnosis not present

## 2024-02-17 DIAGNOSIS — R42 Dizziness and giddiness: Secondary | ICD-10-CM | POA: Diagnosis not present

## 2024-02-21 DIAGNOSIS — F411 Generalized anxiety disorder: Secondary | ICD-10-CM | POA: Diagnosis not present

## 2024-02-21 DIAGNOSIS — F422 Mixed obsessional thoughts and acts: Secondary | ICD-10-CM | POA: Diagnosis not present

## 2024-02-22 DIAGNOSIS — F331 Major depressive disorder, recurrent, moderate: Secondary | ICD-10-CM | POA: Diagnosis not present

## 2024-02-22 DIAGNOSIS — F428 Other obsessive-compulsive disorder: Secondary | ICD-10-CM | POA: Diagnosis not present

## 2024-02-22 DIAGNOSIS — F419 Anxiety disorder, unspecified: Secondary | ICD-10-CM | POA: Diagnosis not present

## 2024-02-22 DIAGNOSIS — F411 Generalized anxiety disorder: Secondary | ICD-10-CM | POA: Diagnosis not present

## 2024-03-13 DIAGNOSIS — F429 Obsessive-compulsive disorder, unspecified: Secondary | ICD-10-CM | POA: Diagnosis not present

## 2024-03-13 DIAGNOSIS — F331 Major depressive disorder, recurrent, moderate: Secondary | ICD-10-CM | POA: Diagnosis not present

## 2024-03-13 DIAGNOSIS — F422 Mixed obsessional thoughts and acts: Secondary | ICD-10-CM | POA: Diagnosis not present

## 2024-03-13 DIAGNOSIS — F411 Generalized anxiety disorder: Secondary | ICD-10-CM | POA: Diagnosis not present

## 2024-04-03 DIAGNOSIS — F411 Generalized anxiety disorder: Secondary | ICD-10-CM | POA: Diagnosis not present

## 2024-04-03 DIAGNOSIS — F422 Mixed obsessional thoughts and acts: Secondary | ICD-10-CM | POA: Diagnosis not present

## 2024-04-24 DIAGNOSIS — F422 Mixed obsessional thoughts and acts: Secondary | ICD-10-CM | POA: Diagnosis not present

## 2024-04-24 DIAGNOSIS — F411 Generalized anxiety disorder: Secondary | ICD-10-CM | POA: Diagnosis not present

## 2024-05-15 DIAGNOSIS — E669 Obesity, unspecified: Secondary | ICD-10-CM | POA: Diagnosis not present

## 2024-05-15 DIAGNOSIS — Z6833 Body mass index (BMI) 33.0-33.9, adult: Secondary | ICD-10-CM | POA: Diagnosis not present

## 2024-05-15 DIAGNOSIS — M51369 Other intervertebral disc degeneration, lumbar region without mention of lumbar back pain or lower extremity pain: Secondary | ICD-10-CM | POA: Diagnosis not present

## 2024-05-15 DIAGNOSIS — Z1331 Encounter for screening for depression: Secondary | ICD-10-CM | POA: Diagnosis not present

## 2024-05-29 DIAGNOSIS — F411 Generalized anxiety disorder: Secondary | ICD-10-CM | POA: Diagnosis not present

## 2024-05-29 DIAGNOSIS — F422 Mixed obsessional thoughts and acts: Secondary | ICD-10-CM | POA: Diagnosis not present

## 2024-06-19 DIAGNOSIS — F411 Generalized anxiety disorder: Secondary | ICD-10-CM | POA: Diagnosis not present

## 2024-06-19 DIAGNOSIS — F422 Mixed obsessional thoughts and acts: Secondary | ICD-10-CM | POA: Diagnosis not present

## 2024-07-10 DIAGNOSIS — F411 Generalized anxiety disorder: Secondary | ICD-10-CM | POA: Diagnosis not present

## 2024-07-10 DIAGNOSIS — F422 Mixed obsessional thoughts and acts: Secondary | ICD-10-CM | POA: Diagnosis not present

## 2024-08-07 DIAGNOSIS — F411 Generalized anxiety disorder: Secondary | ICD-10-CM | POA: Diagnosis not present

## 2024-08-07 DIAGNOSIS — F422 Mixed obsessional thoughts and acts: Secondary | ICD-10-CM | POA: Diagnosis not present

## 2024-08-25 DIAGNOSIS — R051 Acute cough: Secondary | ICD-10-CM | POA: Diagnosis not present

## 2024-08-25 DIAGNOSIS — J329 Chronic sinusitis, unspecified: Secondary | ICD-10-CM | POA: Diagnosis not present

## 2024-08-25 DIAGNOSIS — R0981 Nasal congestion: Secondary | ICD-10-CM | POA: Diagnosis not present

## 2024-08-28 DIAGNOSIS — F422 Mixed obsessional thoughts and acts: Secondary | ICD-10-CM | POA: Diagnosis not present

## 2024-08-28 DIAGNOSIS — F411 Generalized anxiety disorder: Secondary | ICD-10-CM | POA: Diagnosis not present

## 2024-09-27 DIAGNOSIS — J324 Chronic pansinusitis: Secondary | ICD-10-CM | POA: Diagnosis not present

## 2024-09-27 DIAGNOSIS — Z6834 Body mass index (BMI) 34.0-34.9, adult: Secondary | ICD-10-CM | POA: Diagnosis not present

## 2024-09-28 DIAGNOSIS — F422 Mixed obsessional thoughts and acts: Secondary | ICD-10-CM | POA: Diagnosis not present

## 2024-09-28 DIAGNOSIS — F411 Generalized anxiety disorder: Secondary | ICD-10-CM | POA: Diagnosis not present
